# Patient Record
Sex: Female | Born: 1970 | Race: Black or African American | Hispanic: No | Marital: Single | Smoking: Never smoker
Health system: Southern US, Community
[De-identification: ages and names within clinical notes are randomized; demographics above are authoritative.]

## PROBLEM LIST (undated history)

## (undated) DIAGNOSIS — D569 Thalassemia, unspecified: Secondary | ICD-10-CM

## (undated) DIAGNOSIS — R011 Cardiac murmur, unspecified: Secondary | ICD-10-CM

## (undated) DIAGNOSIS — S83289A Other tear of lateral meniscus, current injury, unspecified knee, initial encounter: Secondary | ICD-10-CM

## (undated) DIAGNOSIS — S83249A Other tear of medial meniscus, current injury, unspecified knee, initial encounter: Secondary | ICD-10-CM

## (undated) DIAGNOSIS — M797 Fibromyalgia: Secondary | ICD-10-CM

## (undated) DIAGNOSIS — I82409 Acute embolism and thrombosis of unspecified deep veins of unspecified lower extremity: Secondary | ICD-10-CM

## (undated) HISTORY — PX: DILATION AND CURETTAGE OF UTERUS: SHX78

## (undated) HISTORY — PX: KNEE SURGERY: SHX244

## (undated) HISTORY — PX: HERNIA REPAIR: SHX51

## (undated) HISTORY — PX: OTHER SURGICAL HISTORY: SHX169

## (undated) HISTORY — DX: Cardiac murmur, unspecified: R01.1

## (undated) HISTORY — PX: LAPAROSCOPIC GASTRIC BANDING: SHX1100

---

## 2011-04-20 ENCOUNTER — Emergency Department (HOSPITAL_COMMUNITY): Payer: Medicaid - Out of State

## 2011-04-20 ENCOUNTER — Emergency Department (HOSPITAL_COMMUNITY)
Admission: EM | Admit: 2011-04-20 | Discharge: 2011-04-20 | Disposition: A | Discharge status: Home / Self Care | Payer: Medicaid - Out of State | Attending: Emergency Medicine | Admitting: Emergency Medicine

## 2011-04-20 DIAGNOSIS — K5900 Constipation, unspecified: Secondary | ICD-10-CM | POA: Insufficient documentation

## 2011-04-20 DIAGNOSIS — IMO0001 Reserved for inherently not codable concepts without codable children: Secondary | ICD-10-CM | POA: Insufficient documentation

## 2011-04-20 DIAGNOSIS — R1013 Epigastric pain: Secondary | ICD-10-CM | POA: Insufficient documentation

## 2011-04-20 LAB — COMPREHENSIVE METABOLIC PANEL
BUN: 14 mg/dL (ref 6–23)
Calcium: 9.4 mg/dL (ref 8.4–10.5)
Creatinine, Ser: 0.63 mg/dL (ref 0.50–1.10)
GFR calc Af Amer: >90 mL/min (ref >90)
GFR calc non Af Amer: >90 mL/min (ref >90)
Glucose, Bld: 87 mg/dL (ref 70–99)
Sodium: 139 mEq/L (ref 135–145)
Total Protein: 7.5 g/dL (ref 6.0–8.3)

## 2011-04-20 LAB — CBC
HCT: 30.5 % — ABNORMAL LOW (ref 36.0–46.0)
MCH: 20.4 pg — ABNORMAL LOW (ref 26.0–34.0)
MCHC: 30.8 g/dL (ref 30.0–36.0)
MCV: 66.2 fL — ABNORMAL LOW (ref 78.0–100.0)
RDW: 16.8 % — ABNORMAL HIGH (ref 11.5–15.5)

## 2011-04-20 LAB — URINALYSIS, ROUTINE W REFLEX MICROSCOPIC
Nitrite: NEGATIVE
Protein, ur: 30 mg/dL — AB
Specific Gravity, Urine: 1.045 — ABNORMAL HIGH (ref 1.005–1.030)
Urobilinogen, UA: 1 mg/dL (ref 0.0–1.0)

## 2011-04-20 LAB — LIPASE, BLOOD: Lipase: 18 U/L (ref 11–59)

## 2011-04-20 LAB — DIFFERENTIAL
Eosinophils Relative: 9 % — ABNORMAL HIGH (ref 0–5)
Lymphs Abs: 2.5 10*3/uL (ref 0.7–4.0)
Monocytes Absolute: 0.4 10*3/uL (ref 0.1–1.0)
Neutro Abs: 2.6 10*3/uL (ref 1.7–7.7)

## 2011-04-20 LAB — URINE MICROSCOPIC-ADD ON

## 2011-11-20 ENCOUNTER — Other Ambulatory Visit: Payer: Self-pay | Admitting: Orthopedic Surgery

## 2011-12-04 ENCOUNTER — Ambulatory Visit (HOSPITAL_BASED_OUTPATIENT_CLINIC_OR_DEPARTMENT_OTHER): Admission: RE | Admit: 2011-12-04 | Payer: Self-pay | Source: Ambulatory Visit | Admitting: Orthopedic Surgery

## 2011-12-04 ENCOUNTER — Encounter (HOSPITAL_BASED_OUTPATIENT_CLINIC_OR_DEPARTMENT_OTHER): Admission: RE | Payer: Self-pay | Source: Ambulatory Visit

## 2011-12-04 SURGERY — ARTHROSCOPY, KNEE, WITH MENISCUS REPAIR
Anesthesia: Choice | Laterality: Right

## 2012-01-26 ENCOUNTER — Other Ambulatory Visit: Payer: Self-pay | Admitting: Obstetrics

## 2012-01-26 DIAGNOSIS — Z1231 Encounter for screening mammogram for malignant neoplasm of breast: Secondary | ICD-10-CM

## 2012-02-12 ENCOUNTER — Ambulatory Visit (HOSPITAL_COMMUNITY): Payer: Medicaid Other

## 2012-03-01 ENCOUNTER — Ambulatory Visit (HOSPITAL_COMMUNITY)
Admission: RE | Admit: 2012-03-01 | Discharge: 2012-03-01 | Disposition: A | Discharge status: Home / Self Care | Payer: Medicaid Other | Source: Ambulatory Visit | Attending: Obstetrics | Admitting: Obstetrics

## 2012-03-01 DIAGNOSIS — Z1231 Encounter for screening mammogram for malignant neoplasm of breast: Secondary | ICD-10-CM

## 2012-03-03 ENCOUNTER — Other Ambulatory Visit: Payer: Self-pay | Admitting: Internal Medicine

## 2012-03-03 DIAGNOSIS — E049 Nontoxic goiter, unspecified: Secondary | ICD-10-CM

## 2012-03-07 ENCOUNTER — Other Ambulatory Visit (HOSPITAL_COMMUNITY): Payer: Medicaid Other

## 2012-03-15 ENCOUNTER — Ambulatory Visit (HOSPITAL_COMMUNITY)
Admission: RE | Admit: 2012-03-15 | Discharge: 2012-03-15 | Disposition: A | Discharge status: Home / Self Care | Payer: Medicaid Other | Source: Ambulatory Visit | Attending: Internal Medicine | Admitting: Internal Medicine

## 2012-03-15 DIAGNOSIS — E041 Nontoxic single thyroid nodule: Secondary | ICD-10-CM | POA: Insufficient documentation

## 2012-03-15 DIAGNOSIS — E049 Nontoxic goiter, unspecified: Secondary | ICD-10-CM

## 2012-03-17 ENCOUNTER — Emergency Department (HOSPITAL_COMMUNITY): Payer: Medicaid Other

## 2012-03-17 ENCOUNTER — Encounter (HOSPITAL_COMMUNITY): Payer: Self-pay | Admitting: Emergency Medicine

## 2012-03-17 ENCOUNTER — Emergency Department (HOSPITAL_COMMUNITY)
Admission: EM | Admit: 2012-03-17 | Discharge: 2012-03-18 | Disposition: A | Discharge status: Home / Self Care | Payer: Medicaid Other | Attending: Emergency Medicine | Admitting: Emergency Medicine

## 2012-03-17 DIAGNOSIS — N949 Unspecified condition associated with female genital organs and menstrual cycle: Secondary | ICD-10-CM | POA: Insufficient documentation

## 2012-03-17 DIAGNOSIS — D649 Anemia, unspecified: Secondary | ICD-10-CM | POA: Insufficient documentation

## 2012-03-17 DIAGNOSIS — Z9884 Bariatric surgery status: Secondary | ICD-10-CM | POA: Insufficient documentation

## 2012-03-17 DIAGNOSIS — R1013 Epigastric pain: Secondary | ICD-10-CM | POA: Insufficient documentation

## 2012-03-17 DIAGNOSIS — K5900 Constipation, unspecified: Secondary | ICD-10-CM | POA: Insufficient documentation

## 2012-03-17 DIAGNOSIS — N938 Other specified abnormal uterine and vaginal bleeding: Secondary | ICD-10-CM | POA: Insufficient documentation

## 2012-03-17 HISTORY — DX: Fibromyalgia: M79.7

## 2012-03-17 HISTORY — DX: Thalassemia, unspecified: D56.9

## 2012-03-17 LAB — CBC
HCT: 26.9 % — ABNORMAL LOW (ref 36.0–46.0)
Hemoglobin: 7.8 g/dL — ABNORMAL LOW (ref 12.0–15.0)
MCH: 18.1 pg — ABNORMAL LOW (ref 26.0–34.0)
MCHC: 29 g/dL — ABNORMAL LOW (ref 30.0–36.0)
RBC: 4.3 MIL/uL (ref 3.87–5.11)

## 2012-03-17 LAB — POCT I-STAT, CHEM 8
BUN: 4 mg/dL — ABNORMAL LOW (ref 6–23)
Chloride: 104 mEq/L (ref 96–112)
Creatinine, Ser: 0.9 mg/dL (ref 0.50–1.10)
Sodium: 142 mEq/L (ref 135–145)
TCO2: 25 mmol/L (ref 0–100)

## 2012-03-17 LAB — URINALYSIS, ROUTINE W REFLEX MICROSCOPIC
Bilirubin Urine: NEGATIVE
Glucose, UA: NEGATIVE mg/dL
Ketones, ur: NEGATIVE mg/dL
Nitrite: NEGATIVE
Specific Gravity, Urine: 1.024 (ref 1.005–1.030)
pH: 7 (ref 5.0–8.0)

## 2012-03-17 LAB — URINE MICROSCOPIC-ADD ON

## 2012-03-17 LAB — POCT PREGNANCY, URINE: Preg Test, Ur: NEGATIVE

## 2012-03-17 MED ORDER — GI COCKTAIL ~~LOC~~
30.0000 mL | Freq: Once | ORAL | Status: AC
  Administered 2012-03-17: 30 mL via ORAL
  Filled 2012-03-17: qty 30

## 2012-03-17 NOTE — ED Notes (Signed)
Reports pain in upper abd X 4 days ago, started [facility] 4 months ago, then 6 days ago having vaginal bleeding-has gone through 10 pads yesterday, reports it has eased up some today; pt taking Loestrin; denies n/v

## 2012-03-17 NOTE — ED Notes (Signed)
Pt states, "I am half way through my pill packet & had another 9 days before my cycle is suppose to start. I get a lot of UTIs & only know I have them when my urine smells. My urine does not have that smell right now."

## 2012-03-17 NOTE — ED Notes (Signed)
Patient transported to X-ray 

## 2012-03-17 NOTE — ED Notes (Signed)
Dr Otter at bedside  

## 2012-03-18 ENCOUNTER — Other Ambulatory Visit (HOSPITAL_COMMUNITY): Payer: Medicaid Other

## 2012-03-18 ENCOUNTER — Emergency Department (HOSPITAL_COMMUNITY): Payer: Medicaid Other

## 2012-03-18 LAB — COMPREHENSIVE METABOLIC PANEL
ALT: 7 U/L (ref 0–35)
Alkaline Phosphatase: 61 U/L (ref 39–117)
BUN: 5 mg/dL — ABNORMAL LOW (ref 6–23)
CO2: 26 mEq/L (ref 19–32)
Chloride: 104 mEq/L (ref 96–112)
GFR calc Af Amer: >90 mL/min (ref >90)
GFR calc non Af Amer: >90 mL/min (ref >90)
Glucose, Bld: 92 mg/dL (ref 70–99)
Potassium: 3.4 mEq/L — ABNORMAL LOW (ref 3.5–5.1)
Sodium: 140 mEq/L (ref 135–145)
Total Bilirubin: 0.2 mg/dL — ABNORMAL LOW (ref 0.3–1.2)

## 2012-03-18 MED ORDER — FERROUS SULFATE 325 (65 FE) MG PO TABS: 325.0000 mg | ORAL_TABLET | Freq: Every day | ORAL | Status: DC

## 2012-03-18 MED ORDER — DOCUSATE SODIUM 100 MG PO CAPS: 100.0000 mg | ORAL_CAPSULE | Freq: Two times a day (BID) | ORAL | Status: AC

## 2012-03-18 MED ORDER — PANTOPRAZOLE SODIUM 20 MG PO TBEC: 40.0000 mg | DELAYED_RELEASE_TABLET | Freq: Every day | ORAL | Status: DC

## 2012-03-18 NOTE — ED Provider Notes (Signed)
History     CSN: [id number]  Arrival date & time 03/17/12  1901   First MD Initiated Contact with Patient 03/17/12 2256      Chief Complaint  Patient presents with  . Vaginal Bleeding    (Consider location/radiation/quality/duration/timing/severity/associated sxs/prior treatment) HPI 41 year old female presents to emergency department complaining of heavy vaginal bleeding for the last 6 days, better today along with upper abdominal pain for the last 4 days. Patient with history of anemia/thalassemia, is on OCP, Loestrin for birth control use. She's been on it for the last 4 months. She denies missing any doses or taking them at different times of the day. No other new medications. Patient reports she was going through about a pad an hour yesterday. She occasionally gets lightheaded and dizzy but no worse today than any other day. No chest pain or shortness of breath. Patient with history of lap band done over a year ago, also has history of GERD. She's been taking Pepcid without improvement in her symptoms. She has history of chronic constipation for which she takes milk of magnesia. No fevers no chills. No vaginal discharge, no lower abdominal pain. No history of fibroids. Patient was told by her gynecologist to come to the ER for workup.  Past Medical History  Diagnosis Date  . Fibromyalgia   . Thalassanemia     Past Surgical History  Procedure Date  . Knee surgery     L knee  . Laparoscopic gastric banding     History reviewed. No pertinent family history.  History  Substance Use Topics  . Smoking status: Never Smoker   . Smokeless tobacco: Not on file  . Alcohol Use: Yes     occasionally    OB History    Grav Para Term Preterm Abortions TAB SAB Ect Mult Living                  Review of Systems  All other systems reviewed and are negative.    Allergies  Review of patient's allergies indicates no known allergies.  Home Medications   Current Outpatient Rx    Name Route Sig Dispense Refill  . CETIRIZINE HCL 10 MG PO TABS Oral Take 10 mg by mouth daily as needed. For allergies    . FAMOTIDINE 10 MG PO CHEW Oral Chew 20 mg by mouth 2 (two) times daily as needed. For stomach pain    . GABAPENTIN 800 MG PO TABS Oral Take 800 mg by mouth 3 (three) times daily.    . IBUPROFEN 600 MG PO TABS Oral Take 600 mg by mouth every 6 (six) hours as needed. For pain    . OXYCODONE HCL ER 10 MG PO TB12 Oral Take 10 mg by mouth every 12 (twelve) hours. For pain    . TRAMADOL HCL 50 MG PO TABS Oral Take 50 mg by mouth 3 (three) times daily.      BP 119/72  Pulse 65  Temp 98.4 F (36.9 C) (Oral)  Resp 18  SpO2 100%  LMP 03/17/2012  Physical Exam  Nursing note and vitals reviewed. Constitutional: She is oriented to person, place, and time. She appears well-developed and well-nourished.  HENT:  Head: Normocephalic and atraumatic.  Nose: Nose normal.  Mouth/Throat: Oropharynx is clear and moist.  Eyes: Conjunctivae and EOM are normal. Pupils are equal, round, and reactive to light.  Neck: Normal range of motion. Neck supple. No JVD present. No tracheal deviation present. No thyromegaly present.  Cardiovascular: Normal  rate, regular rhythm, normal heart sounds and intact distal pulses.  Exam reveals no gallop and no friction rub.   No murmur heard. Pulmonary/Chest: Effort normal and breath sounds normal. No stridor. No respiratory distress. She has no wheezes. She has no rales. She exhibits no tenderness.  Abdominal: Soft. Bowel sounds are normal. She exhibits no distension and no mass. There is no tenderness. There is no rebound and no guarding.  Genitourinary: Uterus normal. Vaginal discharge (thin bloody discharge) found.       No adnexal masses or tenderness, no uterine masses or tenderness, no cervical motion tenderness  Musculoskeletal: Normal range of motion. She exhibits no edema and no tenderness.  Lymphadenopathy:    She has no cervical adenopathy.   Neurological: She is alert and oriented to person, place, and time. She has normal reflexes. She exhibits normal muscle tone. Coordination normal.  Skin: Skin is warm and dry. No rash noted. No erythema. No pallor.  Psychiatric: She has a normal mood and affect. Her behavior is normal. Judgment and thought content normal.    ED Course  Procedures (including critical care time)  Labs Reviewed  URINALYSIS, ROUTINE W REFLEX MICROSCOPIC - Abnormal; Notable for the following:    Hgb urine dipstick LARGE (*)     Leukocytes, UA SMALL (*)     All other components within normal limits  CBC - Abnormal; Notable for the following:    Hemoglobin 7.8 (*)     HCT 26.9 (*)     MCV 62.6 (*)     MCH 18.1 (*)     MCHC 29.0 (*)     RDW 17.0 (*)     All other components within normal limits  POCT I-STAT, CHEM 8 - Abnormal; Notable for the following:    BUN 4 (*)     Glucose, Bld 106 (*)     Calcium, Ion 1.24 (*)     Hemoglobin 10.2 (*)     HCT 30.0 (*)     All other components within normal limits  URINE MICROSCOPIC-ADD ON - Abnormal; Notable for the following:    Squamous Epithelial / LPF FEW (*)     Bacteria, UA FEW (*)     All other components within normal limits  COMPREHENSIVE METABOLIC PANEL - Abnormal; Notable for the following:    Potassium 3.4 (*)     BUN 5 (*)     Albumin 3.2 (*)     Total Bilirubin 0.2 (*)     All other components within normal limits  POCT PREGNANCY, URINE  LIPASE, BLOOD   US Transvaginal Non-ob  03/18/2012  *RADIOLOGY REPORT*  Clinical Data: Vaginal bleeding, anemia, pain  TRANSABDOMINAL AND TRANSVAGINAL ULTRASOUND OF PELVIS Technique:  Both transabdominal and transvaginal ultrasound examinations of the pelvis were performed. Transabdominal technique was performed for global imaging of the pelvis including uterus, ovaries, adnexal regions, and pelvic cul-de-sac.  It was necessary to proceed with endovaginal exam following the transabdominal exam to visualize the  endometrium and ovaries.  Comparison:  None  Findings:  Uterus: 8.4 cm length by 5.6 cm AP by 7.1 cm transverse. Retroflexed.  Two small leiomyoma are to identified, including a subserosal leiomyoma 1.4 x 1.1 x 1.8 cm at the mid uterine segment and and intramural leiomyoma at the upper uterine segment 2.7 x 3.0 x 2.7 cm.  The larger lesion questionably extends submucosal.  Endometrium: Suboptimally visualized, proximally 8 mm thick.  No endometrial fluid peri  Right ovary:  3.3 x  2.3 x 2.2 cm.  Septated cyst within the right ovary 2.4 x 1.6 x 1.4 cm.  Septation is thin without mural nodularity.  Blood flow present within right ovary on color Doppler imaging.  Left ovary: 3.0 x 1.4 x 1.2 cm.  Normal morphology without mass. Blood flow present within left ovary on color Doppler imaging.  Other findings: Small amount nonspecific free pelvic fluid.  IMPRESSION: Small amount nonspecific free pelvic fluid. Two uterine leiomyomata, the largest of which measures 3.0 cm in greatest size and questionably extends submucosal at the uterine fundus. Minimally complicated cyst right ovary containing a thin septation.   Original Report Authenticated By: Lollie Marrow, M.D.    US Pelvis Complete  03/18/2012  *RADIOLOGY REPORT*  Clinical Data: Vaginal bleeding, anemia, pain  TRANSABDOMINAL AND TRANSVAGINAL ULTRASOUND OF PELVIS Technique:  Both transabdominal and transvaginal ultrasound examinations of the pelvis were performed. Transabdominal technique was performed for global imaging of the pelvis including uterus, ovaries, adnexal regions, and pelvic cul-de-sac.  It was necessary to proceed with endovaginal exam following the transabdominal exam to visualize the endometrium and ovaries.  Comparison:  None  Findings:  Uterus: 8.4 cm length by 5.6 cm AP by 7.1 cm transverse. Retroflexed.  Two small leiomyoma are to identified, including a subserosal leiomyoma 1.4 x 1.1 x 1.8 cm at the mid uterine segment and and intramural  leiomyoma at the upper uterine segment 2.7 x 3.0 x 2.7 cm.  The larger lesion questionably extends submucosal.  Endometrium: Suboptimally visualized, proximally 8 mm thick.  No endometrial fluid peri  Right ovary:  3.3 x 2.3 x 2.2 cm.  Septated cyst within the right ovary 2.4 x 1.6 x 1.4 cm.  Septation is thin without mural nodularity.  Blood flow present within right ovary on color Doppler imaging.  Left ovary: 3.0 x 1.4 x 1.2 cm.  Normal morphology without mass. Blood flow present within left ovary on color Doppler imaging.  Other findings: Small amount nonspecific free pelvic fluid.  IMPRESSION: Small amount nonspecific free pelvic fluid. Two uterine leiomyomata, the largest of which measures 3.0 cm in greatest size and questionably extends submucosal at the uterine fundus. Minimally complicated cyst right ovary containing a thin septation.   Original Report Authenticated By: Lollie Marrow, M.D.    Dg Abd Acute W/chest  03/18/2012  *RADIOLOGY REPORT*  Clinical Data: Upper abdominal pain and pressure, heavy menstruation for 4 days, question constipation, past history laparoscopic gastric band procedure  ACUTE ABDOMEN SERIES (ABDOMEN 2 VIEW & CHEST 1 VIEW)  Comparison: 04/20/2011  Findings: Upper-normal size of cardiac silhouette. Tortuous aorta. Pulmonary vascularity normal. Lungs clear. Laparoscopic gastric band identified at proximal stomach with reservoir in left lower quadrant. Increased stool in colon. No bowel dilatation, bowel wall thickening or free intraperitoneal air. Bones unremarkable. No urinary tract calcification.  IMPRESSION: Increased stool in colon, clinically consider constipation.   Original Report Authenticated By: Lollie Marrow, M.D.      1. Epigastric pain   2. Dysfunctional uterine bleeding   3. Constipation   4. Anemia       MDM  41 year old female with heavy vaginal bleeding in the middle of the pill pack, along with upper abdominal pain with history of lap band.  Differential includes obstruction secondary to lap band, however this is unlikely given that she is not vomiting her nauseated. Ongoing gastritis, worsening constipation. Suspect vaginal bleeding secondary to breakthrough bleeding on the pill, but will check ultrasound for possible fibroids leading to  heavy bleeding. We'll get acute abdominal series and give GI cocktail.  1:16 AM Patient feeling better after GI cocktail, no signs of obstruction from her lab band, constipation present. Ultrasound of pelvis shows uterine leiomyomata.  Anemia noted.  Pt reports she had cbc about 4 months ago with hgb of 7.  Pt does not show any signs of acute decompensation from anemia, no tachycardia, cp, or sob.  D/w Dr Gaynell Face on call for her gyn group who recommends continuing the pack and calling for appointment on Monday.        Olivia Mackie, MD 03/18/12 [phone]

## 2012-03-18 NOTE — ED Notes (Signed)
PT back from radiology.  Denies any passage of clots or any lower abd pain. VS wnl.

## 2012-03-19 ENCOUNTER — Other Ambulatory Visit (HOSPITAL_COMMUNITY): Payer: Medicaid Other

## 2012-05-12 ENCOUNTER — Other Ambulatory Visit: Payer: Self-pay | Admitting: Orthopedic Surgery

## 2012-05-17 ENCOUNTER — Encounter (HOSPITAL_BASED_OUTPATIENT_CLINIC_OR_DEPARTMENT_OTHER): Payer: Self-pay | Admitting: *Deleted

## 2012-05-17 NOTE — Progress Notes (Signed)
Pt lives with brother-from [place] Has had lt knee done and lap band-lost 70 lb. No cardiac or resp problems

## 2012-05-20 ENCOUNTER — Encounter (HOSPITAL_BASED_OUTPATIENT_CLINIC_OR_DEPARTMENT_OTHER): Payer: Self-pay | Admitting: Orthopedic Surgery

## 2012-05-20 ENCOUNTER — Ambulatory Visit (HOSPITAL_BASED_OUTPATIENT_CLINIC_OR_DEPARTMENT_OTHER)
Admission: RE | Admit: 2012-05-20 | Discharge: 2012-05-20 | Disposition: A | Discharge status: Home / Self Care | Payer: Medicaid Other | Source: Ambulatory Visit | Attending: Orthopedic Surgery | Admitting: Orthopedic Surgery

## 2012-05-20 ENCOUNTER — Encounter (HOSPITAL_BASED_OUTPATIENT_CLINIC_OR_DEPARTMENT_OTHER): Payer: Self-pay | Admitting: *Deleted

## 2012-05-20 ENCOUNTER — Ambulatory Visit (HOSPITAL_BASED_OUTPATIENT_CLINIC_OR_DEPARTMENT_OTHER): Payer: Medicaid Other | Admitting: Anesthesiology

## 2012-05-20 ENCOUNTER — Encounter (HOSPITAL_BASED_OUTPATIENT_CLINIC_OR_DEPARTMENT_OTHER): Payer: Self-pay | Admitting: Anesthesiology

## 2012-05-20 ENCOUNTER — Encounter (HOSPITAL_BASED_OUTPATIENT_CLINIC_OR_DEPARTMENT_OTHER)
Admission: RE | Disposition: A | Discharge status: Home / Self Care | Payer: Self-pay | Source: Ambulatory Visit | Attending: Orthopedic Surgery

## 2012-05-20 DIAGNOSIS — X58XXXA Exposure to other specified factors, initial encounter: Secondary | ICD-10-CM | POA: Insufficient documentation

## 2012-05-20 DIAGNOSIS — D569 Thalassemia, unspecified: Secondary | ICD-10-CM | POA: Insufficient documentation

## 2012-05-20 DIAGNOSIS — S83289A Other tear of lateral meniscus, current injury, unspecified knee, initial encounter: Secondary | ICD-10-CM | POA: Diagnosis present

## 2012-05-20 DIAGNOSIS — S83249A Other tear of medial meniscus, current injury, unspecified knee, initial encounter: Secondary | ICD-10-CM

## 2012-05-20 DIAGNOSIS — IMO0001 Reserved for inherently not codable concepts without codable children: Secondary | ICD-10-CM | POA: Insufficient documentation

## 2012-05-20 DIAGNOSIS — Z79899 Other long term (current) drug therapy: Secondary | ICD-10-CM | POA: Insufficient documentation

## 2012-05-20 DIAGNOSIS — IMO0002 Reserved for concepts with insufficient information to code with codable children: Secondary | ICD-10-CM | POA: Insufficient documentation

## 2012-05-20 HISTORY — DX: Other tear of medial meniscus, current injury, unspecified knee, initial encounter: S83.249A

## 2012-05-20 HISTORY — PX: KNEE ARTHROSCOPY WITH LATERAL MENISECTOMY: SHX6193

## 2012-05-20 HISTORY — DX: Other tear of lateral meniscus, current injury, unspecified knee, initial encounter: S83.289A

## 2012-05-20 SURGERY — ARTHROSCOPY, KNEE, WITH LATERAL MENISCECTOMY
Anesthesia: General | Site: Knee | Laterality: Right | Wound class: Clean

## 2012-05-20 MED ORDER — SODIUM CHLORIDE 0.9 % IR SOLN
Status: DC | PRN
  Administered 2012-05-20: 6000 mL

## 2012-05-20 MED ORDER — KETOROLAC TROMETHAMINE 10 MG PO TABS: 10.0000 mg | ORAL_TABLET | Freq: Four times a day (QID) | ORAL | Status: DC | PRN

## 2012-05-20 MED ORDER — LIDOCAINE HCL (CARDIAC) 20 MG/ML IV SOLN
INTRAVENOUS | Status: DC | PRN
  Administered 2012-05-20: 100 mg via INTRAVENOUS

## 2012-05-20 MED ORDER — PROMETHAZINE HCL 25 MG PO TABS: 25.0000 mg | ORAL_TABLET | Freq: Four times a day (QID) | ORAL | Status: DC | PRN

## 2012-05-20 MED ORDER — METHOCARBAMOL 500 MG PO TABS: 500.0000 mg | ORAL_TABLET | Freq: Four times a day (QID) | ORAL | Status: DC

## 2012-05-20 MED ORDER — ONDANSETRON HCL 4 MG/2ML IJ SOLN: 4.0000 mg | Freq: Once | INTRAMUSCULAR | Status: DC | PRN

## 2012-05-20 MED ORDER — OXYCODONE HCL 5 MG PO TABS
5.0000 mg | ORAL_TABLET | Freq: Once | ORAL | Status: AC | PRN
  Administered 2012-05-20: 5 mg via ORAL

## 2012-05-20 MED ORDER — PROPOFOL 10 MG/ML IV BOLUS
INTRAVENOUS | Status: DC | PRN
  Administered 2012-05-20: 200 mg via INTRAVENOUS

## 2012-05-20 MED ORDER — KETOROLAC TROMETHAMINE 30 MG/ML IJ SOLN
30.0000 mg | Freq: Once | INTRAMUSCULAR | Status: AC
  Administered 2012-05-20: 30 mg via INTRAVENOUS

## 2012-05-20 MED ORDER — MEPERIDINE HCL 25 MG/ML IJ SOLN: 6.2500 mg | INTRAMUSCULAR | Status: DC | PRN

## 2012-05-20 MED ORDER — OXYCODONE-ACETAMINOPHEN 10-325 MG PO TABS: 1.0000 | ORAL_TABLET | Freq: Four times a day (QID) | ORAL | Status: DC | PRN

## 2012-05-20 MED ORDER — SENNA-DOCUSATE SODIUM 8.6-50 MG PO TABS: 1.0000 | ORAL_TABLET | Freq: Every day | ORAL | Status: DC

## 2012-05-20 MED ORDER — HYDROMORPHONE HCL PF 1 MG/ML IJ SOLN
0.2500 mg | INTRAMUSCULAR | Status: DC | PRN
  Administered 2012-05-20 (×4): 0.5 mg via INTRAVENOUS

## 2012-05-20 MED ORDER — BISACODYL 5 MG PO TBEC: 5.0000 mg | DELAYED_RELEASE_TABLET | Freq: Every day | ORAL | Status: DC | PRN

## 2012-05-20 MED ORDER — MIDAZOLAM HCL 5 MG/5ML IJ SOLN
INTRAMUSCULAR | Status: DC | PRN
  Administered 2012-05-20: 2 mg via INTRAVENOUS

## 2012-05-20 MED ORDER — LACTATED RINGERS IV SOLN
INTRAVENOUS | Status: DC
Start: 2012-05-20 — End: 2012-05-20
  Administered 2012-05-20: 07:00:00 via INTRAVENOUS

## 2012-05-20 MED ORDER — ONDANSETRON HCL 4 MG/2ML IJ SOLN
INTRAMUSCULAR | Status: DC | PRN
  Administered 2012-05-20: 4 mg via INTRAVENOUS

## 2012-05-20 MED ORDER — BUPIVACAINE HCL (PF) 0.5 % IJ SOLN
INTRAMUSCULAR | Status: DC | PRN
  Administered 2012-05-20: 20 mL

## 2012-05-20 MED ORDER — DEXAMETHASONE SODIUM PHOSPHATE 4 MG/ML IJ SOLN
INTRAMUSCULAR | Status: DC | PRN
  Administered 2012-05-20: 10 mg via INTRAVENOUS

## 2012-05-20 MED ORDER — FENTANYL CITRATE 0.05 MG/ML IJ SOLN
INTRAMUSCULAR | Status: DC | PRN
  Administered 2012-05-20: 100 ug via INTRAVENOUS

## 2012-05-20 MED ORDER — CEFAZOLIN SODIUM-DEXTROSE 2-3 GM-% IV SOLR: 2.0000 g | INTRAVENOUS | Status: DC

## 2012-05-20 MED ORDER — OXYCODONE HCL 5 MG/5ML PO SOLN: 5.0000 mg | Freq: Once | ORAL | Status: AC | PRN

## 2012-05-20 SURGICAL SUPPLY — 46 items
BANDAGE ELASTIC 6 VELCRO ST LF (GAUZE/BANDAGES/DRESSINGS) ×2 IMPLANT
BANDAGE ESMARK 6X9 LF (GAUZE/BANDAGES/DRESSINGS) IMPLANT
BENZOIN TINCTURE PRP APPL 2/3 (GAUZE/BANDAGES/DRESSINGS) ×2 IMPLANT
BLADE CUDA 5.5 (BLADE) IMPLANT
BLADE CUDA GRT WHITE 3.5 (BLADE) IMPLANT
BLADE CUDA SHAVER 3.5 (BLADE) IMPLANT
BLADE CUTTER GATOR 3.5 (BLADE) ×2 IMPLANT
BLADE CUTTER MENIS 5.5 (BLADE) IMPLANT
BLADE GREAT WHITE 4.2 (BLADE) IMPLANT
BNDG ESMARK 6X9 LF (GAUZE/BANDAGES/DRESSINGS)
CANISTER OMNI JUG 16 LITER (MISCELLANEOUS) ×2 IMPLANT
CANISTER SUCTION 2500CC (MISCELLANEOUS) IMPLANT
CLOTH BEACON ORANGE TIMEOUT ST (SAFETY) ×2 IMPLANT
CUFF TOURNIQUET SINGLE 34IN LL (TOURNIQUET CUFF) IMPLANT
CUTTER KNOT PUSHER 2-0 FIBERWI (INSTRUMENTS) IMPLANT
CUTTER MENISCUS  4.2MM (BLADE)
CUTTER MENISCUS 4.2MM (BLADE) IMPLANT
DRAPE ARTHROSCOPY W/POUCH 90 (DRAPES) ×2 IMPLANT
DURAPREP 26ML APPLICATOR (WOUND CARE) ×2 IMPLANT
ELECT MENISCUS 165MM 90D (ELECTRODE) IMPLANT
ELECT REM PT RETURN 9FT ADLT (ELECTROSURGICAL)
ELECTRODE REM PT RTRN 9FT ADLT (ELECTROSURGICAL) IMPLANT
GLOVE BIO SURGEON STRL SZ8 (GLOVE) ×2 IMPLANT
GLOVE BIOGEL M STRL SZ7.5 (GLOVE) ×2 IMPLANT
GLOVE BIOGEL PI IND STRL 8 (GLOVE) ×3 IMPLANT
GLOVE BIOGEL PI INDICATOR 8 (GLOVE) ×3
GLOVE ORTHO TXT STRL SZ7.5 (GLOVE) ×2 IMPLANT
GOWN BRE IMP PREV XXLGXLNG (GOWN DISPOSABLE) ×4 IMPLANT
GOWN STRL REIN 2XL LVL4 (GOWN DISPOSABLE) ×2 IMPLANT
HOLDER KNEE FOAM BLUE (MISCELLANEOUS) ×2 IMPLANT
KNEE WRAP E Z 3 GEL PACK (MISCELLANEOUS) ×2 IMPLANT
NEEDLE MENISCAL REPAIR DBL ARM (NEEDLE) IMPLANT
NEEDLE MENISCAL REPAIR W/EYELT (NEEDLE) IMPLANT
PACK ARTHROSCOPY DSU (CUSTOM PROCEDURE TRAY) ×2 IMPLANT
PACK BASIN DAY SURGERY FS (CUSTOM PROCEDURE TRAY) ×2 IMPLANT
PENCIL BUTTON HOLSTER BLD 10FT (ELECTRODE) IMPLANT
SET ARTHROSCOPY TUBING (MISCELLANEOUS) ×1
SET ARTHROSCOPY TUBING LN (MISCELLANEOUS) ×1 IMPLANT
SLEEVE SCD COMPRESS KNEE MED (MISCELLANEOUS) ×2 IMPLANT
SPONGE GAUZE 4X4 12PLY (GAUZE/BANDAGES/DRESSINGS) ×2 IMPLANT
STRIP CLOSURE SKIN 1/2X4 (GAUZE/BANDAGES/DRESSINGS) ×2 IMPLANT
SUT MNCRL AB 4-0 PS2 18 (SUTURE) ×2 IMPLANT
TOWEL OR 17X24 6PK STRL BLUE (TOWEL DISPOSABLE) ×2 IMPLANT
TOWEL OR NON WOVEN STRL DISP B (DISPOSABLE) IMPLANT
WAND STAR VAC 90 (SURGICAL WAND) ×2 IMPLANT
WATER STERILE IRR 1000ML POUR (IV SOLUTION) ×2 IMPLANT

## 2012-05-20 NOTE — Anesthesia Postprocedure Evaluation (Signed)
Anesthesia Post Note  Patient: Meghan Solis  Procedure(s) Performed: Procedure(s) (LRB): KNEE ARTHROSCOPY WITH LATERAL MENISECTOMY (Right)  Anesthesia type: general  Patient location: PACU  Post pain: Pain level controlled  Post assessment: Patient's Cardiovascular Status Stable  Last Vitals:  Filed Vitals:   05/20/12 1018  BP: 115/79  Pulse: 90  Temp: 36.6 C  Resp: 20    Post vital signs: Reviewed and stable  Level of consciousness: sedated  Complications: No apparent anesthesia complications

## 2012-05-20 NOTE — Anesthesia Preprocedure Evaluation (Signed)

## 2012-05-20 NOTE — Anesthesia Postprocedure Evaluation (Signed)
Anesthesia Post Note  Patient: Meghan Solis  Procedure(s) Performed: Procedure(s) (LRB): KNEE ARTHROSCOPY WITH LATERAL MENISECTOMY (Right)  Anesthesia type: general  Patient location: PACU  Post pain: Pain level controlled  Post assessment: Patient's Cardiovascular Status Stable  Last Vitals:  Filed Vitals:   05/20/12 1018  BP: 115/79  Pulse: 90  Temp: 36.6 C  Resp: 20    Post vital signs: Reviewed and stable  Level of consciousness: sedated  Complications: No apparent anesthesia complications  

## 2012-05-20 NOTE — Transfer of Care (Signed)
Immediate Anesthesia Transfer of Care Note  Patient: Meghan Solis  Procedure(s) Performed: Procedure(s) (LRB) with comments: KNEE ARTHROSCOPY WITH LATERAL MENISECTOMY (Right) - RIGHT KNEE ARTHROSCOPY LATERAL AND MEDIAL MENISCECTOMY   Patient Location: PACU  Anesthesia Type:General  Level of Consciousness: sedated and patient cooperative  Airway & Oxygen Therapy: Patient Spontanous Breathing and Patient connected to face mask oxygen  Post-op Assessment: Report given to PACU RN and Post -op Vital signs reviewed and stable  Post vital signs: Reviewed and stable  Complications: No apparent anesthesia complications

## 2012-05-20 NOTE — Anesthesia Procedure Notes (Signed)
Procedure Name: LMA Insertion Date/Time: 05/20/2012 7:37 AM Performed by: Gar Gibbon Pre-anesthesia Checklist: Patient identified, Emergency Drugs available, Suction available and Patient being monitored Patient Re-evaluated:Patient Re-evaluated prior to inductionOxygen Delivery Method: Circle System Utilized Preoxygenation: Pre-oxygenation with 100% oxygen Intubation Type: IV induction Ventilation: Mask ventilation without difficulty LMA: LMA inserted LMA Size: 4.0 Number of attempts: 1 Airway Equipment and Method: bite block Placement Confirmation: positive ETCO2 Tube secured with: Tape Dental Injury: Teeth and Oropharynx as per pre-operative assessment

## 2012-05-20 NOTE — Op Note (Signed)
05/20/2012  8:34 AM  PATIENT:  Meghan Solis    PRE-OPERATIVE DIAGNOSIS:  RIGHT KNEE LATERAL MENISCAL TEAR  POST-OPERATIVE DIAGNOSIS:  Same  PROCEDURE:  KNEE ARTHROSCOPY WITH MEDIAL AND LATERAL MENISCECTOMY, CHONDROPLASTY OF THE MEDIAL FEMORAL CONDYLE, LATERAL FEMORAL CONDYLE AND FEMORAL TROCHLEA  SURGEON:  Eulas Post, MD  PHYSICIAN ASSISTANT: Janace Litten, OPA-C, present and scrubbed throughout the case, critical for completion in a timely fashion, and for retraction, instrumentation, and closure.  ANESTHESIA:   General  PREOPERATIVE INDICATIONS:  Meghan Solis is a  41 y.o. female with a diagnosis of RIGHT KNEE LATERAL MENISCAL TEAR who failed conservative measures and elected for surgical management.    The risks benefits and alternatives were discussed with the patient preoperatively including but not limited to the risks of infection, bleeding, nerve injury, cardiopulmonary complications, the need for revision surgery, among others, and the patient was willing to proceed.  OPERATIVE IMPLANTS: None  OPERATIVE FINDINGS: There was extensive uncontained grade 3 lesions on the medial femoral condyle, as well as some on the lateral side, and a smaller lesion, but definitely present on the femoral trochlea. The medial meniscus had some tearing in the central portion of the posterior horn around to the body. The lateral meniscus had extensive tear in the anterior horn. The anterior cruciate ligament did have mucoid degeneration. The patellofemoral joint had degenerative changes as well. There was no exposed bone, but extensive grade 3 changes.  OPERATIVE PROCEDURE: The patient is brought to the operating room placed in supine position. General anesthesia was administered. The right lower extremity was prepped and draped in usual sterile fashion. Diagnostic arthroscopy was carried out the above-named findings. The arthroscopic shaver was used to debride the femoral trochlea.  The medial  meniscus was debrided with the arthroscopic biter and an endoscopic shaver.  The lateral meniscus was debrided with the arthroscopic shaver as well as a biter, particularly around the anterior horn. I did debride a small portion of undersurface tearing of the posterior horn as well. The arthroscopic ArthroCare was used to achieve cautery of the fat pad over the anterior horn.  The instruments were removed, and the knee was drained and injected and the portals closed with Monocryl followed by Steri-Strips and sterile gauze. She tolerated this well with no complications.

## 2012-05-20 NOTE — H&P (Signed)
PREOPERATIVE H&P  Chief Complaint: RIGHT KNEE: TEAR MENISCUS LATERAL/ANTHORN/POSTHORN 836.1  HPI: Meghan Solis is a 41 y.o. female who presents for preoperative history and physical with a diagnosis of RIGHT KNEE: TEAR MENISCUS LATERAL/ANTHORN/POSTHORN 836.1. Symptoms are rated as moderate to severe, and have been worsening.  This is significantly impairing activities of daily living.  She has elected for surgical management. She's had injections as well as activity modification and anti-inflammatories without relief.  Past Medical History  Diagnosis Date  . Fibromyalgia   . Thalassanemia    Past Surgical History  Procedure Date  . Knee surgery     L knee  . Laparoscopic gastric banding   . Dilation and curettage of uterus    History   Social History  . Marital Status: Single    Spouse Name: N/A    Number of Children: N/A  . Years of Education: N/A   Social History Main Topics  . Smoking status: Never Smoker   . Smokeless tobacco: None  . Alcohol Use: Yes     Comment: occasionally  . Drug Use: No  . Sexually Active:    Other Topics Concern  . None   Social History Narrative  . None   History reviewed. No pertinent family history. Allergies  Allergen Reactions  . Sulfa Antibiotics Swelling   Prior to Admission medications   Medication Sig Start Date End Date Taking? Authorizing Provider  cetirizine (ZYRTEC) 10 MG tablet Take 10 mg by mouth daily as needed. For allergies   Yes Historical Provider, MD  ferrous sulfate 325 (65 FE) MG tablet Take 1 tablet (325 mg total) by mouth daily. 03/18/12 03/18/13 Yes Olivia Mackie, MD  gabapentin (NEURONTIN) 800 MG tablet Take 800 mg by mouth 3 (three) times daily.   Yes Historical Provider, MD  norethindrone-ethinyl estradiol (NECON,BREVICON,MODICON) 0.5-35 MG-MCG tablet Take 1 tablet by mouth daily.   Yes Historical Provider, MD  oxyCODONE (OXYCONTIN) 10 MG 12 hr tablet Take 10 mg by mouth every 12 (twelve) hours. For pain   Yes  Historical Provider, MD  pantoprazole (PROTONIX) 20 MG tablet Take 2 tablets (40 mg total) by mouth daily. 03/18/12 03/18/13 Yes Olivia Mackie, MD  traMADol (ULTRAM) 50 MG tablet Take 50 mg by mouth 3 (three) times daily.   Yes Historical Provider, MD  ibuprofen (ADVIL,MOTRIN) 600 MG tablet Take 600 mg by mouth every 6 (six) hours as needed. For pain    Historical Provider, MD     Positive ROS: All other systems have been reviewed and were otherwise negative with the exception of those mentioned in the HPI and as above.  Physical Exam: General: Alert, no acute distress Cardiovascular: No pedal edema Respiratory: No cyanosis, no use of accessory musculature GI: No organomegaly, abdomen is soft and non-tender Skin: No lesions in the area of chief complaint Neurologic: Sensation intact distally Psychiatric: Patient is competent for consent with normal mood and affect Lymphatic: No axillary or cervical lymphadenopathy  MUSCULOSKELETAL: Right knee has positive lateral joint line pain, as well as pain somewhat diffusely. Range of motion from 0 to 120.  Assessment: RIGHT KNEE: TEAR MENISCUS LATERAL/ANTHORN/POSTHORN 836.1  Plan: Plan for Procedure(s): KNEE ARTHROSCOPY WITH LATERAL MENISECTOMY  The risks benefits and alternatives were discussed with the patient including but not limited to the risks of nonoperative treatment, versus surgical intervention including infection, bleeding, nerve injury,  blood clots, cardiopulmonary complications, morbidity, mortality, among others, and they were willing to proceed. We also discussed the risks of  incomplete relief of symptoms, progression of arthritis, need for future arthroplasty.  Quindarrius Joplin P, MD Cell [phone] Pager [phone]  05/20/2012 7:23 AM

## 2012-06-01 ENCOUNTER — Ambulatory Visit (HOSPITAL_COMMUNITY)
Admission: RE | Admit: 2012-06-01 | Discharge: 2012-06-01 | Disposition: A | Discharge status: Home / Self Care | Payer: Medicaid Other | Source: Ambulatory Visit | Attending: Internal Medicine | Admitting: Internal Medicine

## 2012-06-01 ENCOUNTER — Other Ambulatory Visit (HOSPITAL_COMMUNITY): Payer: Self-pay | Admitting: Orthopedic Surgery

## 2012-06-01 DIAGNOSIS — M79604 Pain in right leg: Secondary | ICD-10-CM

## 2012-06-01 DIAGNOSIS — M79609 Pain in unspecified limb: Secondary | ICD-10-CM | POA: Insufficient documentation

## 2012-06-01 NOTE — Progress Notes (Signed)
RLE venous duplex completed. 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 Meghan Solis

## 2012-09-09 ENCOUNTER — Other Ambulatory Visit (HOSPITAL_COMMUNITY): Payer: Self-pay | Admitting: Cardiology

## 2012-09-09 DIAGNOSIS — R609 Edema, unspecified: Secondary | ICD-10-CM

## 2012-09-23 ENCOUNTER — Other Ambulatory Visit (HOSPITAL_COMMUNITY): Payer: Self-pay | Admitting: Cardiology

## 2012-09-23 ENCOUNTER — Ambulatory Visit (HOSPITAL_COMMUNITY)
Admission: RE | Admit: 2012-09-23 | Discharge: 2012-09-23 | Disposition: A | Discharge status: Home / Self Care | Payer: Medicaid Other | Source: Ambulatory Visit | Attending: Cardiology | Admitting: Cardiology

## 2012-09-23 DIAGNOSIS — M7989 Other specified soft tissue disorders: Secondary | ICD-10-CM

## 2012-09-23 DIAGNOSIS — I82409 Acute embolism and thrombosis of unspecified deep veins of unspecified lower extremity: Secondary | ICD-10-CM | POA: Insufficient documentation

## 2012-09-23 DIAGNOSIS — I82401 Acute embolism and thrombosis of unspecified deep veins of right lower extremity: Secondary | ICD-10-CM

## 2012-09-23 DIAGNOSIS — I82403 Acute embolism and thrombosis of unspecified deep veins of lower extremity, bilateral: Secondary | ICD-10-CM

## 2012-09-23 DIAGNOSIS — R609 Edema, unspecified: Secondary | ICD-10-CM

## 2012-09-23 NOTE — Progress Notes (Signed)
Right Lower Extremity Venous Duplex completed.  Negative for DVT.  Meghan Solis

## 2012-09-28 ENCOUNTER — Other Ambulatory Visit (HOSPITAL_COMMUNITY): Payer: Self-pay | Admitting: Cardiology

## 2012-09-28 DIAGNOSIS — I872 Venous insufficiency (chronic) (peripheral): Secondary | ICD-10-CM

## 2012-09-30 ENCOUNTER — Encounter (HOSPITAL_COMMUNITY): Payer: Medicaid Other

## 2012-10-13 ENCOUNTER — Ambulatory Visit (HOSPITAL_COMMUNITY)
Admission: RE | Admit: 2012-10-13 | Discharge: 2012-10-13 | Disposition: A | Discharge status: Home / Self Care | Payer: Medicaid Other | Source: Ambulatory Visit | Attending: Cardiology | Admitting: Cardiology

## 2012-10-13 DIAGNOSIS — I872 Venous insufficiency (chronic) (peripheral): Secondary | ICD-10-CM | POA: Insufficient documentation

## 2012-10-13 NOTE — Progress Notes (Signed)
Venous Duplex Lower Ext. Completed. Meghan Solis D  

## 2012-11-03 ENCOUNTER — Other Ambulatory Visit: Payer: Medicaid Other | Admitting: *Deleted

## 2012-11-03 DIAGNOSIS — D649 Anemia, unspecified: Secondary | ICD-10-CM

## 2012-11-04 ENCOUNTER — Inpatient Hospital Stay (HOSPITAL_COMMUNITY)
Admission: AD | Admit: 2012-11-04 | Discharge: 2012-11-04 | Disposition: A | Discharge status: Home / Self Care | Payer: Medicaid Other | Source: Ambulatory Visit | Attending: Obstetrics | Admitting: Obstetrics

## 2012-11-04 ENCOUNTER — Encounter (HOSPITAL_COMMUNITY): Payer: Self-pay | Admitting: *Deleted

## 2012-11-04 ENCOUNTER — Observation Stay (HOSPITAL_BASED_OUTPATIENT_CLINIC_OR_DEPARTMENT_OTHER)
Admission: EM | Admit: 2012-11-04 | Discharge: 2012-11-05 | Disposition: A | Discharge status: Home / Self Care | Payer: Medicaid Other | Source: Home / Self Care | Attending: Emergency Medicine | Admitting: Emergency Medicine

## 2012-11-04 ENCOUNTER — Encounter (HOSPITAL_COMMUNITY): Payer: Self-pay | Admitting: Emergency Medicine

## 2012-11-04 DIAGNOSIS — D649 Anemia, unspecified: Secondary | ICD-10-CM

## 2012-11-04 DIAGNOSIS — S83249D Other tear of medial meniscus, current injury, unspecified knee, subsequent encounter: Secondary | ICD-10-CM

## 2012-11-04 DIAGNOSIS — R0602 Shortness of breath: Secondary | ICD-10-CM

## 2012-11-04 DIAGNOSIS — D569 Thalassemia, unspecified: Secondary | ICD-10-CM

## 2012-11-04 DIAGNOSIS — S83289D Other tear of lateral meniscus, current injury, unspecified knee, subsequent encounter: Secondary | ICD-10-CM

## 2012-11-04 DIAGNOSIS — R42 Dizziness and giddiness: Secondary | ICD-10-CM

## 2012-11-04 DIAGNOSIS — R5383 Other fatigue: Secondary | ICD-10-CM

## 2012-11-04 HISTORY — DX: Acute embolism and thrombosis of unspecified deep veins of unspecified lower extremity: I82.409

## 2012-11-04 LAB — CBC
Hemoglobin: 6 g/dL — CL (ref 12.0–15.0)
RBC: 3.91 MIL/uL (ref 3.87–5.11)
WBC: 5.5 10*3/uL (ref 4.0–10.5)

## 2012-11-04 LAB — BASIC METABOLIC PANEL
CO2: 27 mEq/L (ref 19–32)
Chloride: 102 mEq/L (ref 96–112)
Glucose, Bld: 90 mg/dL (ref 70–99)
Potassium: 3.1 mEq/L — ABNORMAL LOW (ref 3.5–5.1)
Sodium: 138 mEq/L (ref 135–145)

## 2012-11-04 LAB — CBC WITH DIFFERENTIAL/PLATELET
Basophils Absolute: 0 10*3/uL (ref 0.0–0.1)
Eosinophils Relative: 6 % — ABNORMAL HIGH (ref 0–5)
Lymphocytes Relative: 36 % (ref 12–46)
Lymphocytes Relative: 44 % (ref 12–46)
Lymphs Abs: 2.5 10*3/uL (ref 0.7–4.0)
MCV: 58.2 fL — ABNORMAL LOW (ref 78.0–100.0)
Neutro Abs: 3 10*3/uL (ref 1.7–7.7)
Platelets: 363 10*3/uL (ref 150–400)
Platelets: 262 10*3/uL (ref 150–400)
RBC: 4.19 MIL/uL (ref 3.87–5.11)
RDW: 20.2 % — ABNORMAL HIGH (ref 11.5–15.5)
WBC: 5.9 10*3/uL (ref 4.0–10.5)
WBC: 5.6 10*3/uL (ref 4.0–10.5)

## 2012-11-04 LAB — PREPARE RBC (CROSSMATCH)

## 2012-11-04 NOTE — ED Provider Notes (Signed)
History     CSN: [id number]  Arrival date & time 11/04/12  2135   First MD Initiated Contact with Patient 11/04/12 2147      Chief Complaint  Patient presents with  . needs transfusion     (Consider location/radiation/quality/duration/timing/severity/associated sxs/prior treatment) The history is provided by the patient and medical records. No language interpreter was used.    Meghan Solis is a 42 y.o. female  with a hx of LC and anemia presents to the Emergency Department complaining of gradual, persistent, progressively worsening breast breath, fatigue, dizziness onset 4 months ago. Patient was seen by her oncologist yesterday and she mentioned to the symptoms. Gynecology ran a CBC and the patient was found to be severely anemic with a hemoglobin of 6.4. Patient states this is chronically where she runs however the symptoms have been progressive over the last several months. She states that time she is so tired she has to lay down and take a nap on her drive home from work.. States nothing makes it better and is makes it worse. Patient denies fever, chills, headache, neck pain, chest pain, abdominal pain, nausea, vomiting, diarrhea,, dysuria, hematuria. Patient denies blood in urine or bowel. She denies heavy menstrual cycles. Patient also denies hematemesis.   Past Medical History  Diagnosis Date  . Fibromyalgia   . Thalassanemia   . Medial meniscus tear, right knee 05/20/2012  . Lateral meniscus tear 05/20/2012  . DVT (deep venous thrombosis)     Past Surgical History  Procedure Laterality Date  . Knee surgery      L knee  . Laparoscopic gastric banding    . Dilation and curettage of uterus    . Knee arthroscopy with lateral menisectomy  05/20/2012    Procedure: KNEE ARTHROSCOPY WITH LATERAL MENISECTOMY;  Surgeon: Eulas Post, MD;  Location: [facility];  Service: Orthopedics;  Laterality: Right;  RIGHT KNEE ARTHROSCOPY LATERAL AND MEDIAL MENISCECTOMY   .  Fatty tumor removed    . Hernia repair      Family History  Problem Relation Age of Onset  . Diabetes Mother   . Cancer Father     History  Substance Use Topics  . Smoking status: Never Smoker   . Smokeless tobacco: Not on file  . Alcohol Use: Yes     Comment: occasionally    OB History   Grav Para Term Preterm Abortions TAB SAB Ect Mult Living   3 2 2  1  1   2       Review of Systems  Constitutional: Positive for fatigue. Negative for fever, diaphoresis, appetite change and unexpected weight change.  HENT: Negative for mouth sores and neck stiffness.   Eyes: Negative for visual disturbance.  Respiratory: Negative for cough, chest tightness, shortness of breath and wheezing.   Cardiovascular: Negative for chest pain.  Gastrointestinal: Negative for nausea, vomiting, abdominal pain, diarrhea and constipation.  Endocrine: Negative for polydipsia, polyphagia and polyuria.  Genitourinary: Negative for dysuria, urgency, frequency, hematuria and menstrual problem.  Musculoskeletal: Positive for myalgias. Negative for back pain.  Skin: Negative for rash.  Allergic/Immunologic: Negative for immunocompromised state.  Neurological: Positive for weakness and light-headedness. Negative for syncope and headaches.  Hematological: Does not bruise/bleed easily.  Psychiatric/Behavioral: Negative for sleep disturbance. The patient is not nervous/anxious.     Allergies  Sulfa antibiotics  Home Medications   Current Outpatient Rx  Name  Route  Sig  Dispense  Refill  . ferrous sulfate 325 (65  FE) MG tablet   Oral   Take 1 tablet (325 mg total) by mouth daily.   30 tablet   0   . gabapentin (NEURONTIN) 800 MG tablet   Oral   Take 800 mg by mouth 3 (three) times daily.         Marland Kitchen ibuprofen (ADVIL,MOTRIN) 600 MG tablet   Oral   Take 600 mg by mouth every 8 (eight) hours as needed for pain.         . Multiple Vitamin (MULTIVITAMIN WITH MINERALS) TABS   Oral   Take 1 tablet  by mouth daily.         Marland Kitchen oxyCODONE (OXYCONTIN) 10 MG 12 hr tablet   Oral   Take 10 mg by mouth every 12 (twelve) hours as needed for pain. For pain         . traMADol (ULTRAM) 50 MG tablet   Oral   Take 50 mg by mouth 3 (three) times daily. scheduled           BP 119/76  Pulse 64  Temp(Src) 98.4 F (36.9 C) (Oral)  Resp 18  SpO2 100%  LMP 10/15/2012  Physical Exam  Nursing note and vitals reviewed. Constitutional: She is oriented to person, place, and time. She appears well-developed and well-nourished. No distress.  HENT:  Head: Normocephalic and atraumatic.  Right Ear: Tympanic membrane, external ear and ear canal normal.  Left Ear: Tympanic membrane, external ear and ear canal normal.  Nose: Nose normal. No mucosal edema or rhinorrhea.  Mouth/Throat: Uvula is midline, oropharynx is clear and moist and mucous membranes are normal. Mucous membranes are not dry and not cyanotic. No oropharyngeal exudate, posterior oropharyngeal edema, posterior oropharyngeal erythema or tonsillar abscesses.  Pale mucus membranes  Eyes: Conjunctivae and EOM are normal. Pupils are equal, round, and reactive to light. No scleral icterus.  Neck: Normal range of motion. Neck supple.  Cardiovascular: Normal rate, regular rhythm, normal heart sounds and intact distal pulses.   Pulmonary/Chest: Effort normal and breath sounds normal. No respiratory distress. She has no wheezes. She has no rales. She exhibits no tenderness.  Abdominal: Soft. Bowel sounds are normal. She exhibits no distension and no mass. There is no tenderness. There is no rebound and no guarding.  Musculoskeletal: Normal range of motion. She exhibits no edema and no tenderness.  Lymphadenopathy:    She has no cervical adenopathy.  Neurological: She is alert and oriented to person, place, and time. She exhibits normal muscle tone. Coordination normal.  Speech is clear and goal oriented Moves extremities without ataxia  Skin:  Skin is warm and dry. She is not diaphoretic. No erythema. There is pallor.  Psychiatric: She has a normal mood and affect. Her behavior is normal.    ED Course  Procedures (including critical care time)  Labs Reviewed  CBC WITH DIFFERENTIAL - Abnormal; Notable for the following:    Hemoglobin 6.4 (*)    HCT 24.4 (*)    MCV 58.2 (*)    MCH 15.3 (*)    MCHC 26.2 (*)    RDW 20.6 (*)    Neutrophils Relative 42 (*)    Eosinophils Relative 6 (*)    All other components within normal limits  BASIC METABOLIC PANEL - Abnormal; Notable for the following:    Potassium 3.1 (*)    All other components within normal limits  RETICULOCYTES  VITAMIN B12  FOLATE  IRON AND TIBC  FERRITIN  PREPARE RBC (CROSSMATCH)  TYPE AND SCREEN  ABO/RH   No results found.   1. Anemia   2. Thalassemia       MDM  Gilford Rile presents with symptomatic anemia hemoglobin of 6.4 and hematocrit of 24.4. Anemia panel pending and patient has been typed and screened for blood.  Will hold transfusion until anemia panel has resulted. Discussed with [facility] hospitalist who will admit for overnight observation and transfusion.  Is alert, oriented, nontoxic, nonseptic appearing. Vital signs stable; no known source for blood loss.       Dahlia Client Omar Orrego, PA-C 11/05/12 [phone]

## 2012-11-04 NOTE — MAU Note (Signed)
Pt D/C with Family.  Family member to drive pt to [facility] Emergency Department for further evaluation.

## 2012-11-04 NOTE — MAU Provider Note (Signed)
History     CSN: [id number]  Arrival date and time: 11/04/12 1920   None     Chief Complaint  Patient presents with  . Fatigue   HPI  Meghan Solis is a 42 y.o. [id number] who is here today with anemia. She states that Dr. Tamela Oddi asked her to come in and be seen. She states that she has been feeling very tired. She has had to pull over while driving and take a nap in a parking lot. She states that she has been very sleepy and dizzy.  She denies any excessive vaginal bleeding, rectal bleeding, black stools or vomiting blood.  She states her LMP was 10/11/12. She reports a history of thalassemia.   Past Medical History  Diagnosis Date  . Fibromyalgia   . Thalassanemia   . Medial meniscus tear, right knee 05/20/2012  . Lateral meniscus tear 05/20/2012  . DVT (deep venous thrombosis)     Past Surgical History  Procedure Laterality Date  . Knee surgery      L knee  . Laparoscopic gastric banding    . Dilation and curettage of uterus    . Knee arthroscopy with lateral menisectomy  05/20/2012    Procedure: KNEE ARTHROSCOPY WITH LATERAL MENISECTOMY;  Surgeon: Eulas Post, MD;  Location: [facility];  Service: Orthopedics;  Laterality: Right;  RIGHT KNEE ARTHROSCOPY LATERAL AND MEDIAL MENISCECTOMY   . Fatty tumor removed    . Hernia repair      Family History  Problem Relation Age of Onset  . Diabetes Mother   . Cancer Father     History  Substance Use Topics  . Smoking status: Never Smoker   . Smokeless tobacco: Not on file  . Alcohol Use: Yes     Comment: occasionally    Allergies:  Allergies  Allergen Reactions  . Sulfa Antibiotics Swelling    Prescriptions prior to admission  Medication Sig Dispense Refill  . ferrous sulfate 325 (65 FE) MG tablet Take 1 tablet (325 mg total) by mouth daily.  30 tablet  0  . gabapentin (NEURONTIN) 800 MG tablet Take 800 mg by mouth 3 (three) times daily.      Marland Kitchen ibuprofen (ADVIL,MOTRIN) 600 MG tablet Take 600  mg by mouth every 8 (eight) hours as needed for pain.      . Multiple Vitamin (MULTIVITAMIN WITH MINERALS) TABS Take 1 tablet by mouth daily.      Marland Kitchen oxyCODONE (OXYCONTIN) 10 MG 12 hr tablet Take 10 mg by mouth every 12 (twelve) hours as needed for pain. For pain      . traMADol (ULTRAM) 50 MG tablet Take 50 mg by mouth 3 (three) times daily.        Review of Systems  Constitutional: Positive for malaise/fatigue. Negative for fever.  Respiratory: Positive for shortness of breath (feels like she is "out of shape" due to lack of exercise. ).   Cardiovascular: Negative for chest pain.  Gastrointestinal: Negative for nausea, vomiting, abdominal pain, diarrhea and constipation.  Genitourinary: Negative for dysuria, urgency and frequency.  Musculoskeletal: Negative for myalgias.  Neurological: Positive for dizziness and weakness. Negative for headaches.   Physical Exam   Blood pressure 108/73, pulse 86, temperature 98.3 F (36.8 C), resp. rate 20, height 5\' 7"  (1.702 m), weight 84.55 kg (186 lb 6.4 oz), last menstrual period 10/15/2012, SpO2 100.00%.  B/P: Lying down: 122/75 mmHg, Pulse: 75          Sitting: 114/74 mmHg,  Pulse: 75          Standing: 108/73 mmHg, Pulse: 86    Physical Exam  Nursing note and vitals reviewed. Constitutional: She is oriented to person, place, and time. She appears well-developed and well-nourished. No distress.  Cardiovascular: Normal rate.   Respiratory: Effort normal.  GI: Soft.  Neurological: She is alert and oriented to person, place, and time.  Skin: Skin is warm and dry.  Psychiatric: She has a normal mood and affect.    2100: Spoke with Dr. Clearance Coots. He is not comfortable or familiar with managing thalassemia. He is recommending having the patient evaluated  at [facility] or [facility].  2110: Spoke with Dr. Lynelle Doctor at [facility] what he would normally do in this situation is he would transfuse the patient and then send for an outpatient Heme consult.  2111: Spoke with Dr.  Clearance Coots again. He wants to the patient to be seen in the ED, and not here.  2115: Spoke with Dr. Lynelle Doctor, ok to transport to [facility] via personal car.   MAU Course  Procedures  Results for orders placed during the hospital encounter of 11/04/12 (from the past 24 hour(s))  CBC     Status: Abnormal   Collection Time    11/04/12  7:45 PM      Result Value Range   WBC 5.5  4.0 - 10.5 K/uL   RBC 3.91  3.87 - 5.11 MIL/uL   Hemoglobin 6.0 (*) 12.0 - 15.0 g/dL   HCT 21.3 (*) 08.6 - 57.8 %   MCV 59.1 (*) 78.0 - 100.0 fL   MCH 15.3 (*) 26.0 - 34.0 pg   MCHC 26.0 (*) 30.0 - 36.0 g/dL   RDW 46.9 (*) 62.9 - 52.8 %   Platelets 226  150 - 400 K/uL     Assessment and Plan   1. Anemia    Go to [facility] now via personal car for further treatment and evaluation.   [name] 11/04/2012, 8:10 PM

## 2012-11-04 NOTE — ED Notes (Signed)
Pt sent to [facility] today by GYN for low H & H, pt sent here for transfusion. Pt states she has chronic low HGB, pt does states she has been feeling weak and SHOB.

## 2012-11-04 NOTE — MAU Note (Signed)
The doctor called and said to meet her here because my blood count is really low. I might need a transfusion. I'm very sleepy and fall asleep easily. I'm always in pain due to fibromyalgia but no new pain

## 2012-11-05 DIAGNOSIS — R42 Dizziness and giddiness: Secondary | ICD-10-CM

## 2012-11-05 DIAGNOSIS — R5383 Other fatigue: Secondary | ICD-10-CM

## 2012-11-05 DIAGNOSIS — D649 Anemia, unspecified: Secondary | ICD-10-CM

## 2012-11-05 DIAGNOSIS — R0602 Shortness of breath: Secondary | ICD-10-CM

## 2012-11-05 DIAGNOSIS — D569 Thalassemia, unspecified: Secondary | ICD-10-CM

## 2012-11-05 LAB — CBC
Hemoglobin: 8.1 g/dL — ABNORMAL LOW (ref 12.0–15.0)
Platelets: 230 10*3/uL (ref 150–400)
RBC: 4.5 MIL/uL (ref 3.87–5.11)
WBC: 5.1 10*3/uL (ref 4.0–10.5)

## 2012-11-05 LAB — IRON AND TIBC
Iron: 11 ug/dL — ABNORMAL LOW (ref 42–135)
Saturation Ratios: 3 % — ABNORMAL LOW (ref 20–55)
TIBC: 407 ug/dL (ref 250–470)
UIBC: 396 ug/dL (ref 125–400)

## 2012-11-05 LAB — BASIC METABOLIC PANEL
CO2: 28 mEq/L (ref 19–32)
Calcium: 8.6 mg/dL (ref 8.4–10.5)
GFR calc non Af Amer: >90 mL/min (ref >90)
Potassium: 3.3 mEq/L — ABNORMAL LOW (ref 3.5–5.1)
Sodium: 139 mEq/L (ref 135–145)

## 2012-11-05 LAB — PREPARE RBC (CROSSMATCH)

## 2012-11-05 LAB — FOLATE: Folate: 15.6 ng/mL

## 2012-11-05 MED ORDER — ADULT MULTIVITAMIN W/MINERALS CH
1.0000 | ORAL_TABLET | Freq: Every day | ORAL | Status: DC
  Administered 2012-11-05: 1 via ORAL
  Filled 2012-11-05: qty 1

## 2012-11-05 MED ORDER — POTASSIUM CHLORIDE CRYS ER 20 MEQ PO TBCR
40.0000 meq | EXTENDED_RELEASE_TABLET | Freq: Once | ORAL | Status: AC
  Administered 2012-11-05: 40 meq via ORAL
  Filled 2012-11-05 (×2): qty 2

## 2012-11-05 MED ORDER — GABAPENTIN 400 MG PO CAPS
800.0000 mg | ORAL_CAPSULE | Freq: Three times a day (TID) | ORAL | Status: DC
  Administered 2012-11-05: 800 mg via ORAL
  Filled 2012-11-05 (×3): qty 2

## 2012-11-05 MED ORDER — ACETAMINOPHEN 650 MG RE SUPP: 650.0000 mg | Freq: Four times a day (QID) | RECTAL | Status: DC | PRN

## 2012-11-05 MED ORDER — FERROUS SULFATE 325 (65 FE) MG PO TABS
325.0000 mg | ORAL_TABLET | Freq: Every day | ORAL | Status: DC
  Administered 2012-11-05: 325 mg via ORAL
  Filled 2012-11-05 (×2): qty 1

## 2012-11-05 MED ORDER — SODIUM CHLORIDE 0.9 % IV SOLN: 250.0000 mL | INTRAVENOUS | Status: DC | PRN

## 2012-11-05 MED ORDER — SODIUM CHLORIDE 0.9 % IJ SOLN
3.0000 mL | Freq: Two times a day (BID) | INTRAMUSCULAR | Status: DC
  Administered 2012-11-05: 3 mL via INTRAVENOUS

## 2012-11-05 MED ORDER — SODIUM CHLORIDE 0.9 % IJ SOLN
3.0000 mL | Freq: Two times a day (BID) | INTRAMUSCULAR | Status: DC
  Administered 2012-11-05 (×2): 3 mL via INTRAVENOUS

## 2012-11-05 MED ORDER — SODIUM CHLORIDE 0.9 % IV SOLN: INTRAVENOUS | Status: DC

## 2012-11-05 MED ORDER — ACETAMINOPHEN 325 MG PO TABS: 650.0000 mg | ORAL_TABLET | Freq: Four times a day (QID) | ORAL | Status: DC | PRN

## 2012-11-05 MED ORDER — SODIUM CHLORIDE 0.9 % IJ SOLN: 3.0000 mL | INTRAMUSCULAR | Status: DC | PRN

## 2012-11-05 MED ORDER — TRAMADOL HCL 50 MG PO TABS
50.0000 mg | ORAL_TABLET | Freq: Three times a day (TID) | ORAL | Status: DC
  Administered 2012-11-05: 50 mg via ORAL
  Filled 2012-11-05: qty 1

## 2012-11-05 NOTE — Consult Note (Signed)
#   [id number] is consult note.  Meghan E.  Hebrews 12:12

## 2012-11-05 NOTE — Discharge Summary (Signed)
Physician Discharge Summary  Meghan Solis YNW:[id number] DOB: 11-24-1970 DOA: 11/04/2012  PCP: Billee Cashing, MD  Admit date: 11/04/2012 Discharge date: 11/05/2012  Time spent: 50* minutes  Recommendations for Outpatient Follow-up:  1. Follow up PCP in 2 weeks 2. Follow up Hematology as outpatient  Discharge Diagnoses:  Principal Problem:   Anemia Active Problems:   Thalassemia   Fatigue   Dizziness   SOB (shortness of breath)   Discharge Condition: Stable  Diet recommendation: regular diet  Filed Weights   11/05/12 0224  Weight: 84.3 kg (185 lb 13.6 oz)    History of present illness:  42 year old African American woman who has a past medical history that is only significant for thalassemia. She states that for the past 2 weeks she has become increasingly fatigued, has had to take frequent naps during the day. She also describes some dizziness when standing up. She has also been somewhat sort of breath with exertion and she has been attributing this to lack of exercise and being "out of shape". She denies menorrhagia, hematemesis, hematochezia, melena. She had some blood work done by her gynecologist and her hemoglobin resulted at 6.0 so she was asked to come to the emergency department for evaluation. We have been asked to admit her for further management.   Hospital Course:   Symptomatic anemia Resolved,Hb is   s/p two units blood transfusion. Has h/o Thalassemia, will need to be followed by hematology as outpatient Her iron level is 11, will continue on ferrous sulfate. Patient was seen by Hematology Dr Myna Hidalgo, and she will be followed as outpatient for IV iron.  Hypokalemia Potassium is 3.3, will give one dose of Kdur 40 meq po x1  Procedures:  None  Consultations:  None  Discharge Exam: Filed Vitals:   11/05/12 0224 11/05/12 0335 11/05/12 0428 11/05/12 0543  BP: 115/79 111/70 101/61 98/70  Pulse: 66 62 60 65  Temp: 98.1 F (36.7 C) 97.7 F (36.5  C) 97.7 F (36.5 C) 97.4 F (36.3 C)  TempSrc: Oral Oral Oral Oral  Resp: 18 18 18 18   Height: 5\' 7"  (1.702 m)     Weight: 84.3 kg (185 lb 13.6 oz)     SpO2: 100%  100%     General: Appear in no acute distress Cardiovascular: s1s2 RRR Respiratory: Clear bilaterally Ext: No edema  Discharge Instructions  Discharge Orders   Future Orders Complete By Expires     Diet - low sodium heart healthy  As directed     Discharge instructions  As directed     Comments:      Follow up Hematology as outpatient    Increase activity slowly  As directed         Medication List    TAKE these medications       ferrous sulfate 325 (65 FE) MG tablet  Take 1 tablet (325 mg total) by mouth daily.     gabapentin 800 MG tablet  Commonly known as:  NEURONTIN  Take 800 mg by mouth 3 (three) times daily.     ibuprofen 600 MG tablet  Commonly known as:  ADVIL,MOTRIN  Take 600 mg by mouth every 8 (eight) hours as needed for pain.     multivitamin with minerals Tabs  Take 1 tablet by mouth daily.     oxyCODONE 10 MG 12 hr tablet  Commonly known as:  OXYCONTIN  Take 10 mg by mouth every 12 (twelve) hours as needed for pain. For pain  traMADol 50 MG tablet  Commonly known as:  ULTRAM  Take 50 mg by mouth 3 (three) times daily. scheduled          The results of significant diagnostics from this hospitalization (including imaging, microbiology, ancillary and laboratory) are listed below for reference.    Significant Diagnostic Studies: No results found.  Microbiology: No results found for this or any previous visit (from the past 240 hour(s)).   Labs: Basic Metabolic Panel:  Recent Labs Lab 11/04/12 2200 11/05/12 0751  NA 138 139  K 3.1* 3.3*  CL 102 104  CO2 27 28  GLUCOSE 90 88  BUN 7 7  CREATININE 0.67 0.65  CALCIUM 8.8 8.6   Liver Function Tests: No results found for this basename: AST, ALT, ALKPHOS, BILITOT, PROT, ALBUMIN,  in the last 168 hours No results  found for this basename: LIPASE, AMYLASE,  in the last 168 hours No results found for this basename: AMMONIA,  in the last 168 hours CBC:  Recent Labs Lab 11/03/12 1557 11/04/12 1945 11/04/12 2200 11/05/12 0751  WBC 5.9 5.5 5.6 5.1  NEUTROABS 3.0  --  2.3  --   HGB 6.5* 6.0* 6.4* 8.1*  HCT 24.7* 23.1* 24.4* 28.4*  MCV 58.1* 59.1* 58.2* 63.1*  PLT 363 226 262 230   Cardiac Enzymes: No results found for this basename: CKTOTAL, CKMB, CKMBINDEX, TROPONINI,  in the last 168 hours BNP: BNP (last 3 results) No results found for this basename: PROBNP,  in the last 8760 hours CBG: No results found for this basename: GLUCAP,  in the last 168 hours     Signed:  Jordie Skalsky S  [facility] 11/05/2012, 10:01 AM

## 2012-11-05 NOTE — ED Provider Notes (Signed)
Medical screening examination/treatment/procedure(s) were performed by non-physician practitioner and as supervising physician I was immediately available for consultation/collaboration.    Celene Kras, MD 11/05/12 [phone]

## 2012-11-05 NOTE — ED Notes (Signed)
Attempted to call report. No answer.

## 2012-11-05 NOTE — Progress Notes (Signed)
Utilization review completed.  P.J. Sheril Hammond,RN,BSN Case Manager [phone]  

## 2012-11-05 NOTE — H&P (Addendum)
[facility]          History and Physical    PCP:   Billee Cashing, MD   Chief Complaint:  Shortness of breath, fatigue, dizziness  HPI: Patient is a pleasant 42 year old African American woman who has a past medical history that is only significant for thalassemia. She states that for the past 2 weeks she has become increasingly fatigued, has had to take frequent naps during the day. She also describes some dizziness when standing up. She has also been somewhat sort of breath with exertion and she has been attributing this to lack of exercise and being "out of shape". She denies menorrhagia, hematemesis, hematochezia, melena. She had some blood work done by her gynecologist and her hemoglobin resulted at 6.0 so she was asked to come to the emergency department for evaluation. We have been asked to admit her for further management.  Allergies:   Allergies  Allergen Reactions  . Sulfa Antibiotics Swelling      Past Medical History  Diagnosis Date  . Fibromyalgia   . Thalassanemia   . Medial meniscus tear, right knee 05/20/2012  . Lateral meniscus tear 05/20/2012  . DVT (deep venous thrombosis)     Past Surgical History  Procedure Laterality Date  . Knee surgery      L knee  . Laparoscopic gastric banding    . Dilation and curettage of uterus    . Knee arthroscopy with lateral menisectomy  05/20/2012    Procedure: KNEE ARTHROSCOPY WITH LATERAL MENISECTOMY;  Surgeon: Eulas Post, MD;  Location: [facility];  Service: Orthopedics;  Laterality: Right;  RIGHT KNEE ARTHROSCOPY LATERAL AND MEDIAL MENISCECTOMY   . Fatty tumor removed    . Hernia repair      Prior to Admission medications   Medication Sig Start Date End Date Taking? Authorizing Provider  ferrous sulfate 325 (65 FE) MG tablet Take 1 tablet (325 mg total) by mouth daily. 03/18/12 03/18/13 Yes Olivia Mackie, MD  gabapentin (NEURONTIN) 800 MG tablet Take 800 mg by mouth 3 (three) times  daily.   Yes Historical Provider, MD  ibuprofen (ADVIL,MOTRIN) 600 MG tablet Take 600 mg by mouth every 8 (eight) hours as needed for pain.   Yes Historical Provider, MD  Multiple Vitamin (MULTIVITAMIN WITH MINERALS) TABS Take 1 tablet by mouth daily.   Yes Historical Provider, MD  oxyCODONE (OXYCONTIN) 10 MG 12 hr tablet Take 10 mg by mouth every 12 (twelve) hours as needed for pain. For pain   Yes Historical Provider, MD  traMADol (ULTRAM) 50 MG tablet Take 50 mg by mouth 3 (three) times daily. scheduled   Yes Historical Provider, MD    Social History:  reports that she has never smoked. She does not have any smokeless tobacco history on file. She reports that  drinks alcohol. She reports that she does not use illicit drugs.  Family History  Problem Relation Age of Onset  . Diabetes Mother   . Cancer Father     Review of Systems:  Constitutional: Denies fever, chills, diaphoresis, appetite change.  HEENT: Denies photophobia, eye pain, redness, hearing loss, ear pain, congestion, sore throat, rhinorrhea, sneezing, mouth sores, trouble swallowing, neck pain, neck stiffness and tinnitus.   Respiratory: Denies cough, chest tightness,  and wheezing.   Cardiovascular: Denies chest pain, palpitations and leg swelling.  Gastrointestinal: Denies nausea, vomiting, abdominal pain, diarrhea, constipation, blood in stool and abdominal distention.  Genitourinary: Denies dysuria, urgency, frequency, hematuria, flank pain  and difficulty urinating.  Musculoskeletal: Denies myalgias, back pain, joint swelling, arthralgias and gait problem.  Skin: Denies pallor, rash and wound.  Neurological: Denies  seizures, syncope, weakness, light-headedness, numbness and headaches.  Hematological: Denies adenopathy. Easy bruising, personal or family bleeding history  Psychiatric/Behavioral: Denies suicidal ideation, mood changes, confusion, nervousness, sleep disturbance and agitation   Physical Exam: Blood  pressure 119/76, pulse 64, temperature 98.4 F (36.9 C), temperature source Oral, resp. rate 18, last menstrual period 10/15/2012, SpO2 100.00%. General: Alert, awake, oriented x3, in no acute distress. Very pleasant and cooperative with exam. HEENT: Normocephalic, atraumatic, pupils equal round and reactive to light, extraocular movements intact, moist mucous membranes. Neck: Supple, no JVD, no lymphadenopathy, no bruits, no goiter. Cardiovascular: Regular rate and rhythm, no murmurs, rubs or gallops. Lungs: Clear to auscultation bilaterally. Abdomen: Soft, nontender, nondistended, positive bowel sounds, no masses or organomegaly noted. Extremities: No clubbing, cyanosis or edema, positive pedal pulses. Neurologic: Grossly intact and nonfocal.  Labs on Admission:  Results for orders placed during the hospital encounter of 11/04/12 (from the past 48 hour(s))  TYPE AND SCREEN     Status: None   Collection Time    11/04/12 10:00 PM      Result Value Range   ABO/RH(D) O POS     Antibody Screen NEG     Sample Expiration 11/07/2012     Unit Number [id number]     Blood Component Type RED CELLS,LR     Unit division 00     Status of Unit ISSUED     Transfusion Status OK TO TRANSFUSE     Crossmatch Result Compatible     Unit Number [id number]     Blood Component Type RED CELLS,LR     Unit division 00     Status of Unit ALLOCATED     Transfusion Status OK TO TRANSFUSE     Crossmatch Result Compatible    CBC WITH DIFFERENTIAL     Status: Abnormal   Collection Time    11/04/12 10:00 PM      Result Value Range   WBC 5.6  4.0 - 10.5 K/uL   RBC 4.19  3.87 - 5.11 MIL/uL   Hemoglobin 6.4 (*) 12.0 - 15.0 g/dL   Comment: CRITICAL RESULT CALLED TO, READ BACK BY AND VERIFIED WITH:     HAYDEN,R. RN @2312  11/04/12 WELLS,D.   HCT 24.4 (*) 36.0 - 46.0 %   MCV 58.2 (*) 78.0 - 100.0 fL   MCH 15.3 (*) 26.0 - 34.0 pg   MCHC 26.2 (*) 30.0 - 36.0 g/dL   RDW 24.4 (*) 01.0 - 27.2 %   Platelets 262   150 - 400 K/uL   Comment: REPEATED TO VERIFY     SPECIMEN CHECKED FOR CLOTS   Neutrophils Relative 42 (*) 43 - 77 %   Neutro Abs 2.3  1.7 - 7.7 K/uL   Lymphocytes Relative 44  12 - 46 %   Lymphs Abs 2.5  0.7 - 4.0 K/uL   Monocytes Relative 8  3 - 12 %   Monocytes Absolute 0.4  0.1 - 1.0 K/uL   Eosinophils Relative 6 (*) 0 - 5 %   Eosinophils Absolute 0.3  0.0 - 0.7 K/uL   Basophils Relative 1  0 - 1 %   Basophils Absolute 0.0  0.0 - 0.1 K/uL  BASIC METABOLIC PANEL     Status: Abnormal   Collection Time    11/04/12 10:00 PM  Result Value Range   Sodium 138  135 - 145 mEq/L   Potassium 3.1 (*) 3.5 - 5.1 mEq/L   Chloride 102  96 - 112 mEq/L   CO2 27  19 - 32 mEq/L   Glucose, Bld 90  70 - 99 mg/dL   BUN 7  6 - 23 mg/dL   Creatinine, Ser 4.09  0.50 - 1.10 mg/dL   Calcium 8.8  8.4 - 81.1 mg/dL   GFR calc non Af Amer >90  >90 mL/min   GFR calc Af Amer >90  >90 mL/min   Comment:            The eGFR has been calculated     using the CKD EPI equation.     This calculation has not been     validated in all clinical     situations.     eGFR's persistently     <90 mL/min signify     possible Chronic Kidney Disease.  ABO/RH     Status: None   Collection Time    11/04/12 10:00 PM      Result Value Range   ABO/RH(D) O POS    PREPARE RBC (CROSSMATCH)     Status: None   Collection Time    11/04/12 10:30 PM      Result Value Range   Order Confirmation ORDER PROCESSED BY BLOOD BANK    RETICULOCYTES     Status: None   Collection Time    11/04/12 11:30 PM      Result Value Range   Retic Ct Pct 0.7  0.4 - 3.1 %   RBC. 4.01  3.87 - 5.11 MIL/uL   Retic Count, Manual 28.1  19.0 - 186.0 K/uL    Radiological Exams on Admission: No results found.  Assessment/Plan Principal Problem:   Anemia Active Problems:   Thalassemia   Fatigue   Dizziness   SOB (shortness of breath)   Symptomatic Anemia -All of her symptoms can be attributed to her anemia. -Will admit and transfuse 2  units of PRBCs. -She can probably be discharged home in the morning. -Anemia panel has been drawn prior to transfusion and is currently pending. -Patient states that she has a history of thalassemia and has never seen a hematologist before. This can probably be accomplished as an outpatient. -Recheck CBC in the morning.  DVT prophylaxis -SCDs.  Time Spent on Admission: 70 minutes  HERNANDEZ ACOSTA,ESTELA [facility] Pager: [phone] 11/05/2012, 1:02 AM

## 2012-11-06 NOTE — Consult Note (Signed)
NAMEMARIYANNA, [name]               ACCOUNT NO.:  [id number]  MEDICAL RECORD NO.:  [id number]  LOCATION:  1413                         FACILITY:  [facility]  PHYSICIAN:  Josph Macho, M.D.  DATE OF BIRTH:  January 11, 1971  DATE OF CONSULTATION:  11/05/2012 DATE OF DISCHARGE:  11/05/2012                                CONSULTATION   REFERRING PHYSICIAN:  [name], MD.  REASON FOR CONSULTATION:  Microcytic anemia - alpha-thalassemia with iron deficiency.  HISTORY OF PRESENT ILLNESS:  [name] is a very charming 42 year old African American female.  She is originally from [place].  She has been down here for about a year and a half.  The patient says that she has "thalassemia."  She is not sure who else in the family has this.  She does have some heavy monthly cycles.  She has had a gastric bypass 2 years ago.  She did develop a DVT in the right lower leg.  This happened after she had a meniscus surgery.  This was in November, 2013.  She was on Coumadin for 3 months.  She says that her sister has had a lot of "blood clots."  She subsequently was admitted on the 25th.  She came in because of fatigue and weakness.  She was found have a hemoglobin of 6.  She had white cell count of 5.6, platelet count of 262,000.  Her hemoglobin was actually 6.4.  MCV is 58.  She did get I think 2 units of blood.  She is markedly iron deficient.  Her ferritin was 2 with an iron saturation of 3.  She has not got any IV iron.  What is most critical is the fact that her reticulocyte count is basically 0 when corrected for her hemoglobin.  She had a normal vitamin B12.  She does not have a hematologist.  She thought that hematologist might help her.  As such, I was asked to see her before she was discharged.  She does not crave ice.  She has no unusual taste for food.  There has been no weight loss or weight gain.  She has had no change in bowel or bladder habits.  She says that her monthly  cycles are more regular now.  She had electrolytes on admission.  Electrolytes showed normal BUN and creatinine.  PAST MEDICAL HISTORY:  Remarkable for, 1. Fibromyalgia. 2. History of DVT. 3. Right knee meniscal surgery. 4. Gastric banding.  ALLERGIES:  SULFA.  ADMISSION MEDICATIONS: 1. Iron sulfate 325 daily. 2. Neurontin 800 mg p.o. t.i.d. 3. Motrin as indicated. 4. OxyContin 10 mg p.o. q.12 hours. 5. Ultram 50 mg p.o. t.i.d. p.r.n.  SOCIAL HISTORY:  Negative for tobacco use.  There is no significant alcohol use.  She has no recreational drug use.  She has no occupational exposures.  FAMILY HISTORY:  Remarkable for father who died of lung cancer.  Again she is not sure who in her family has thalassemia or anemia.  REVIEW OF SYSTEMS:  As stated in the history of present illness.  PHYSICAL EXAMINATION:  GENERAL:  This is a well-developed, well- nourished [place] American female, in no obvious distress. VITAL SIGNS:  Temperature  of 97.4, pulse 65, respiratory rate 18, blood pressure 98/70. HEAD AND NECK:  Normocephalic and atraumatic skull.  There are no ocular or oral lesions.  There are no palpable cervical or supraclavicular lymph nodes.  She has pale conjunctiva.  She has no glossitis.  Thyroid is not palpable. LUNGS:  Clear bilaterally. CARDIAC:  Regular rate and rhythm with normal S1 and S2.  There are no murmurs, rubs, or bruits. ABDOMEN:  Soft with good bowel sounds.  There is no fluid wave.  She has no palpable abdominal mass.  No palpable hepatosplenomegaly. EXTREMITIES:  No clubbing, cyanosis, or edema. NEUROLOGICAL:  No focal neurological deficits. SKIN:  No rashes, ecchymosis, or petechia.  LABORATORY STUDIES:  White cell count is 5.1, hemoglobin 8.1, hematocrit 28.4, platelet count 230.  MCV is 63.  Peripheral blood smear shows a microcytic population of red blood cells. She does have some target cells.  There is rare schistocyte.  There is no nucleated  red blood cells.  There is no rouleaux formation.  She has no inclusion bodies.  Her white cells appear normal in morphology maturation.  There is no immature myeloid cells.  There are no hypersegmented polys.  I see no atypical lymphocytes.  Platelets are somewhat increased in number.  She has a few large platelets.  IMPRESSION:  [name] is a very charming 42 year old African American female with microcytic anemia.  I have to believe that this is mostly iron deficiency.  She has thalassemia, one will have to think that she has alpha- thalassemia.  Blood smear certainly looks like iron deficient as the major source of her anemia.  The fact that she absolutely has no reticulocyte count goes along with iron deficiency.  She is going to need IV iron.  We can do this in the office after she is discharged.  When she gets to the office, we will check her hemoglobin and electrophoresis.  I will have to be cognizant of this possible family history of thrombophilia.  It sounds like she had a postop thromboembolic event. She was on Coumadin for 3 months.  Again, we will have to be cautious of this.  We will get her into our office.  I did give her my business card.  She is awful nice.  There was fun talking to her.     Josph Macho, M.D.     PRE/MEDQ  D:  11/05/2012  T:  11/06/2012  Job:  [id number]  cc:   Lorelle Formosa, M.D. Fax: [phone]

## 2012-11-07 ENCOUNTER — Telehealth: Payer: Self-pay | Admitting: Hematology & Oncology

## 2012-11-07 NOTE — Telephone Encounter (Signed)
Pt aware of 5-2 appointment

## 2012-11-08 LAB — TYPE AND SCREEN
ABO/RH(D): O POS
Unit division: 0

## 2012-11-09 ENCOUNTER — Ambulatory Visit: Payer: Medicaid Other

## 2012-11-10 ENCOUNTER — Ambulatory Visit: Payer: Medicaid Other | Attending: Orthopedic Surgery | Admitting: Physical Therapy

## 2012-11-10 DIAGNOSIS — M25669 Stiffness of unspecified knee, not elsewhere classified: Secondary | ICD-10-CM | POA: Insufficient documentation

## 2012-11-10 DIAGNOSIS — R269 Unspecified abnormalities of gait and mobility: Secondary | ICD-10-CM | POA: Insufficient documentation

## 2012-11-10 DIAGNOSIS — IMO0001 Reserved for inherently not codable concepts without codable children: Secondary | ICD-10-CM | POA: Insufficient documentation

## 2012-11-10 DIAGNOSIS — M25569 Pain in unspecified knee: Secondary | ICD-10-CM | POA: Insufficient documentation

## 2012-11-11 ENCOUNTER — Telehealth: Payer: Self-pay | Admitting: Hematology & Oncology

## 2012-11-11 ENCOUNTER — Ambulatory Visit: Payer: Medicaid Other

## 2012-11-11 ENCOUNTER — Ambulatory Visit: Payer: Medicaid Other | Admitting: Hematology & Oncology

## 2012-11-11 ENCOUNTER — Other Ambulatory Visit: Payer: Medicaid Other | Admitting: Lab

## 2012-11-11 NOTE — Telephone Encounter (Signed)
Patient called and cx 11/11/12 and resch for 11/14/12

## 2012-11-14 ENCOUNTER — Other Ambulatory Visit (HOSPITAL_BASED_OUTPATIENT_CLINIC_OR_DEPARTMENT_OTHER): Payer: Medicaid Other | Admitting: Lab

## 2012-11-14 ENCOUNTER — Ambulatory Visit (HOSPITAL_BASED_OUTPATIENT_CLINIC_OR_DEPARTMENT_OTHER): Payer: Medicaid Other | Admitting: Hematology & Oncology

## 2012-11-14 ENCOUNTER — Ambulatory Visit (HOSPITAL_BASED_OUTPATIENT_CLINIC_OR_DEPARTMENT_OTHER): Payer: Medicaid Other

## 2012-11-14 VITALS — BP 105/60 | HR 73 | Temp 98.2°F | Resp 16 | Ht 67.0 in | Wt 187.0 lb

## 2012-11-14 DIAGNOSIS — D649 Anemia, unspecified: Secondary | ICD-10-CM

## 2012-11-14 DIAGNOSIS — D509 Iron deficiency anemia, unspecified: Secondary | ICD-10-CM

## 2012-11-14 DIAGNOSIS — D560 Alpha thalassemia: Secondary | ICD-10-CM

## 2012-11-14 DIAGNOSIS — IMO0001 Reserved for inherently not codable concepts without codable children: Secondary | ICD-10-CM

## 2012-11-14 DIAGNOSIS — D569 Thalassemia, unspecified: Secondary | ICD-10-CM

## 2012-11-14 LAB — CBC WITH DIFFERENTIAL (CANCER CENTER ONLY)
BASO%: 0.6 % (ref 0.0–2.0)
EOS%: 6.3 % (ref 0.0–7.0)
LYMPH%: 33.9 % (ref 14.0–48.0)
MCV: 65 fL — ABNORMAL LOW (ref 81–101)
MONO#: 0.4 10*3/uL (ref 0.1–0.9)
Platelets: 261 10*3/uL (ref 145–400)
RDW: 27.9 % — ABNORMAL HIGH (ref 11.1–15.7)
WBC: 5.4 10*3/uL (ref 3.9–10.0)

## 2012-11-14 LAB — IRON AND TIBC
%SAT: 5 % — ABNORMAL LOW (ref 20–55)
Iron: 20 ug/dL — ABNORMAL LOW (ref 42–145)

## 2012-11-14 LAB — FERRITIN: Ferritin: 1 ng/mL — ABNORMAL LOW (ref 10–291)

## 2012-11-14 LAB — CHCC SATELLITE - SMEAR

## 2012-11-14 LAB — TECHNOLOGIST REVIEW CHCC SATELLITE

## 2012-11-14 MED ORDER — SODIUM CHLORIDE 0.9 % IV SOLN
1020.0000 mg | Freq: Once | INTRAVENOUS | Status: AC
  Administered 2012-11-14: 1020 mg via INTRAVENOUS
  Filled 2012-11-14: qty 34

## 2012-11-14 MED ORDER — SODIUM CHLORIDE 0.9 % IV SOLN
Freq: Once | INTRAVENOUS | Status: AC
  Administered 2012-11-14: 12:00:00 via INTRAVENOUS

## 2012-11-14 NOTE — Progress Notes (Signed)
This office note has been dictated.

## 2012-11-14 NOTE — Patient Instructions (Addendum)
Ferumoxytol injection What is this medicine? FERUMOXYTOL is an iron complex. Iron is used to make healthy red blood cells, which carry oxygen and nutrients throughout the body. This medicine is used to treat iron deficiency anemia in people with chronic kidney disease. This medicine may be used for other purposes; ask your health care provider or pharmacist if you have questions. What should I tell my health care provider before I take this medicine? They need to know if you have any of these conditions: -anemia not caused by low iron levels -high levels of iron in the blood -magnetic resonance imaging (MRI) test scheduled -an unusual or allergic reaction to iron, other medicines, foods, dyes, or preservatives -pregnant or trying to get pregnant -breast-feeding How should I use this medicine? This medicine is for infusion into a vein. It is given by a health care professional in a hospital or clinic setting. Talk to your pediatrician regarding the use of this medicine in children. Special care may be needed. Overdosage: If you think you've taken too much of this medicine contact a poison control center or emergency room at once. Overdosage: If you think you have taken too much of this medicine contact a poison control center or emergency room at once. NOTE: This medicine is only for you. Do not share this medicine with others. What if I miss a dose? It is important not to miss your dose. Call your doctor or health care professional if you are unable to keep an appointment. What may interact with this medicine? This medicine may interact with the following medications: -other iron products This list may not describe all possible interactions. Give your health care provider a list of all the medicines, herbs, non-prescription drugs, or dietary supplements you use. Also tell them if you smoke, drink alcohol, or use illegal drugs. Some items may interact with your medicine. What should I watch  for while using this medicine? Visit your doctor or healthcare professional regularly. Tell your doctor or healthcare professional if your symptoms do not start to get better or if they get worse. You may need blood work done while you are taking this medicine. You may need to follow a special diet. Talk to your doctor. Foods that contain iron include: whole grains/cereals, dried fruits, beans, or peas, leafy green vegetables, and organ meats (liver, kidney). What side effects may I notice from receiving this medicine? Side effects that you should report to your doctor or health care professional as soon as possible: -allergic reactions like skin rash, itching or hives, swelling of the face, lips, or tongue -breathing problems -changes in blood pressure -feeling faint or lightheaded, falls -fever or chills -flushing, sweating, or hot feelings -swelling of the ankles or feet Side effects that usually do not require medical attention (Report these to your doctor or health care professional if they continue or are bothersome.): -diarrhea -headache -nausea, vomiting -stomach pain This list may not describe all possible side effects. Call your doctor for medical advice about side effects. You may report side effects to FDA at 1-800-FDA-1088. Where should I keep my medicine? This drug is given in a hospital or clinic and will not be stored at home. NOTE: This sheet is a summary. It may not cover all possible information. If you have questions about this medicine, talk to your doctor, pharmacist, or health care provider.  2012, [employer]vier/Gold Standard. (03/21/2008 9:48:25 PM) 

## 2012-11-15 NOTE — Progress Notes (Signed)
CC:   Lorelle Formosa, M.D.  DIAGNOSIS:  Microcytic anemia-alpha thalassemia, combined with iron deficiency.  CURRENT THERAPY:  The patient to receive IV iron today.  INTERIM HISTORY:  Meghan Solis comes in for her followup.  This is her first office visit.  I saw her in constipation at [facility] on April 26th.  At that point in time, she presented with marked anemia. She does have heavy cycles.  Her hemoglobin I think was 6.4.  She had an MCV of 58.  We did go ahead and get iron studies on her.  Her ferritin was 2 with an iron saturation of 3.  She was discharged.  She felt well.  She now comes to the office for IV iron.  Of note, her reticulocyte count when corrected was 0.1.  She clearly is iron deficient.  She may have a __________ of alpha thalassemia, but this is not a clinical issue from my point of view.  She feels okay right now.  She is still working.  She has had no syncope.  She does feel tired.  Her appetite is okay.  She has had no nausea.  There has been no weight loss or weight gain.  She has had no rashes.  PHYSICAL EXAMINATION:  General:  This is a well-developed, well- nourished black female in no obvious distress.  Vital signs: Temperature of 98.2, pulse 73, respiratory rate 16, blood pressure 105/60.  Weight is 187.  Head and neck:  Normocephalic, atraumatic skull.  Her conjunctivae are pink.  There is no scleral icterus.  There are no oral lesions.  There is no adenopathy in the neck.  Lungs:  Clear bilaterally.  There are no rales, wheezes, or rhonchi.  Cardiac: Regular rate and rhythm with a normal S1 and S2.  There are no murmurs, rubs, or bruits.  Abdomen:  Soft with good bowel sounds.  There is no palpable abdominal mass.  There is no fluid wave.  No palpable hepatosplenomegaly.  Extremities:  No clubbing, cyanosis, or edema. Skin:  No rash, ecchymosis, or petechia.  LABORATORY STUDIES:  White cell count is 5.4, hemoglobin 8.3,  hematocrit 25.9, platelet count 261.  MCV is 65.  Peripheral smear does show microcytic red cells.  There is some polychromasia.  She has target cells.  I see a couple of spherocytes. There are no nucleated red cells.  She has no teardrop cells.  White cells appear normal in morphology and maturation.  There are no immature myeloid or lymphoid forms.  There are no hypersegmented polys. Platelets are adequate in number and size.  IMPRESSION:  Meghan Solis is a very charming 42 year old African American female with alpha thalassemia.  Her main problem now is iron deficiency.  I really believe that with IV iron, we will be able to get her anemia resolved.  She apparently is taking oral iron.  I told her to stop taking the oral iron.  I just do not see that this is really going to help her out any.  Of note, she does have fibromyalgia.  Hopefully, getting iron back into her will help ease up the fibromyalgia pain.  I want to see her back in about 5 weeks so.  By then, we should be able to see her blood count getting up to 10 or above.  We should see the reticulocyte count improving.  I suspect that she may need another dose of iron in the future.    ______________________________ Josph Macho,  M.D. PRE/MEDQ  D:  11/14/2012  T:  11/15/2012  Job:  [id number]

## 2012-11-17 ENCOUNTER — Ambulatory Visit: Payer: Medicaid Other | Admitting: Rehabilitation

## 2012-11-28 ENCOUNTER — Encounter: Payer: Self-pay | Admitting: Obstetrics

## 2012-11-28 ENCOUNTER — Ambulatory Visit (INDEPENDENT_AMBULATORY_CARE_PROVIDER_SITE_OTHER): Payer: Medicaid Other | Admitting: Obstetrics

## 2012-11-28 ENCOUNTER — Ambulatory Visit: Payer: Medicaid Other

## 2012-11-28 ENCOUNTER — Other Ambulatory Visit (INDEPENDENT_AMBULATORY_CARE_PROVIDER_SITE_OTHER): Payer: Medicaid Other

## 2012-11-28 VITALS — BP 105/67 | HR 73 | Temp 98.3°F | Ht 67.0 in | Wt 185.0 lb

## 2012-11-28 DIAGNOSIS — N390 Urinary tract infection, site not specified: Secondary | ICD-10-CM

## 2012-11-28 DIAGNOSIS — Z113 Encounter for screening for infections with a predominantly sexual mode of transmission: Secondary | ICD-10-CM

## 2012-11-28 LAB — POCT URINALYSIS DIPSTICK
Nitrite, UA: NEGATIVE
Urobilinogen, UA: NEGATIVE

## 2012-11-28 NOTE — Progress Notes (Signed)
.   Subjective:     Meghan Solis is a 42 y.o. female here for a problem visit.  Current complaints: Patient states that her urine is cloudy with an odor.  She also has a discharge - denies any odor or itching.  Personal health questionnaire reviewed: yes.   Gynecologic History Patient's last menstrual period was 11/21/2012. Contraception: none Last Pap: 01/2012. Results were:  Normal with positive HPV Last mammogram: 02/2012  Obstetric History OB History   Grav Para Term Preterm Abortions TAB SAB Ect Mult Living   3 2 2  1  1   2      # Outc Date GA Lbr Len/2nd Wgt Sex Del Anes PTL Lv   1 SAB            2 TRM            3 TRM                The following portions of the patient's history were reviewed and updated as appropriate: allergies, current medications, past family history, past medical history, past social history, past surgical history and problem list.  Review of Systems Pertinent items are noted in HPI.    Objective:    General appearance: alert and no distress Abdomen: normal findings: soft, non-tender Pelvic: external genitalia normal and vagina normal without discharge    Assessment:    Healthy female exam.    Plan:   F/U for annual or prn.  H/O Thalassanemia, being followed by Hematology

## 2012-11-28 NOTE — Progress Notes (Unsigned)
Entered in error

## 2012-11-29 LAB — URINE CULTURE

## 2012-11-30 DIAGNOSIS — N390 Urinary tract infection, site not specified: Secondary | ICD-10-CM | POA: Insufficient documentation

## 2012-12-20 ENCOUNTER — Other Ambulatory Visit (HOSPITAL_BASED_OUTPATIENT_CLINIC_OR_DEPARTMENT_OTHER): Payer: Medicaid Other | Admitting: Lab

## 2012-12-20 ENCOUNTER — Ambulatory Visit (HOSPITAL_BASED_OUTPATIENT_CLINIC_OR_DEPARTMENT_OTHER): Payer: Medicaid Other

## 2012-12-20 ENCOUNTER — Ambulatory Visit (HOSPITAL_BASED_OUTPATIENT_CLINIC_OR_DEPARTMENT_OTHER): Payer: Medicaid Other | Admitting: Medical

## 2012-12-20 VITALS — BP 105/67 | HR 72 | Temp 98.0°F | Resp 16 | Ht 67.0 in | Wt 191.0 lb

## 2012-12-20 DIAGNOSIS — D649 Anemia, unspecified: Secondary | ICD-10-CM

## 2012-12-20 DIAGNOSIS — D569 Thalassemia, unspecified: Secondary | ICD-10-CM

## 2012-12-20 DIAGNOSIS — D509 Iron deficiency anemia, unspecified: Secondary | ICD-10-CM

## 2012-12-20 LAB — CBC WITH DIFFERENTIAL (CANCER CENTER ONLY)
BASO#: 0 10*3/uL (ref 0.0–0.2)
Eosinophils Absolute: 0.2 10*3/uL (ref 0.0–0.5)
HCT: 32.6 % — ABNORMAL LOW (ref 34.8–46.6)
HGB: 9.6 g/dL — ABNORMAL LOW (ref 11.6–15.9)
LYMPH%: 37 % (ref 14.0–48.0)
MCH: 21 pg — ABNORMAL LOW (ref 26.0–34.0)
MCV: 71 fL — ABNORMAL LOW (ref 81–101)
MONO#: 0.2 10*3/uL (ref 0.1–0.9)
MONO%: 5.1 % (ref 0.0–13.0)
NEUT#: 2.5 10*3/uL (ref 1.5–6.5)
RBC: 4.58 10*6/uL (ref 3.70–5.32)
WBC: 4.7 10*3/uL (ref 3.9–10.0)

## 2012-12-20 LAB — RETICULOCYTES (CHCC)
RBC.: 4.72 MIL/uL (ref 3.87–5.11)
Retic Ct Pct: 0.4 % (ref 0.4–2.3)

## 2012-12-20 LAB — IRON AND TIBC
%SAT: 39 % (ref 20–55)
UIBC: 125 ug/dL (ref 125–400)

## 2012-12-20 LAB — FERRITIN: Ferritin: 81 ng/mL (ref 10–291)

## 2012-12-20 LAB — CHCC SATELLITE - SMEAR

## 2012-12-20 MED ORDER — SODIUM CHLORIDE 0.9 % IV SOLN
Freq: Once | INTRAVENOUS | Status: AC
  Administered 2012-12-20: 12:00:00 via INTRAVENOUS

## 2012-12-20 MED ORDER — SODIUM CHLORIDE 0.9 % IJ SOLN
3.0000 mL | Freq: Once | INTRAMUSCULAR | Status: DC | PRN
  Filled 2012-12-20: qty 10

## 2012-12-20 MED ORDER — SODIUM CHLORIDE 0.9 % IV SOLN
1020.0000 mg | Freq: Once | INTRAVENOUS | Status: AC
  Administered 2012-12-20: 1020 mg via INTRAVENOUS
  Filled 2012-12-20: qty 34

## 2012-12-20 NOTE — Progress Notes (Signed)
DIAGNOSIS:  Microcytic anemia-alpha thalassemia, combined with iron deficiency.  CURRENT THERAPY:  IV iron as indicated. (Last dose of IV iron was 11/14/12).  INTERIM HISTORY:  Ms. Meghan Solis presents today for an office followup visit.  Overall she reports she's doing quite well.  She recently had IV iron back on 11/14/2012.  At that time her ferritin was 1, iron 20 with 5% saturation.  Her hemoglobin was 8.3.  She reports that she could tell a significant difference with her energy level.  She tolerated IV iron well without any complications.  Her hemoglobin is 9.6 today.  Her MCV is still on the lower side at 71. We will give her another dose of IV iron today.  She reports that she still continues to have very heavy menstrual cycles.  She did discuss this with her gynecologist who recommended an ablation.  She is strongly considering this.  She does have alpha thalassemia, however this does not clinical issue at this point.  She is able to perform her activities of daily living without any hindrance or decline.  She reports a good appetite she states she's not craving ice.  She denies any nausea, vomiting, diarrhea or constipation.  She denies any fevers, chills or night sweats.  She denies any chest pain, shortness of breath or cough.  She denies any palpable adenopathy.  She denies any abdominal pain.  She denies any obvious or abnormal bleeding other than her menstrual cycles.  She denies any lower leg swelling.  She denies any mouth sores or sore tongue.  She denies any headaches, visual changes or rashes.  Review of Systems: Constitutional:Negative for malaise/fatigue, fever, chills, weight loss, diaphoresis, activity change, appetite change, and unexpected weight change.  HEENT: Negative for double vision, blurred vision, visual loss, ear pain, tinnitus, congestion, rhinorrhea, epistaxis sore throat or sinus disease, oral pain/lesion, tongue soreness Respiratory: Negative for cough, chest tightness,  shortness of breath, wheezing and stridor.  Cardiovascular: Negative for chest pain, palpitations, leg swelling, orthopnea, PND, DOE or claudication Gastrointestinal: Negative for nausea, vomiting, abdominal pain, diarrhea, constipation, blood in stool, melena, hematochezia, abdominal distention, anal bleeding, rectal pain, anorexia and hematemesis.  Genitourinary: Negative for dysuria, frequency, hematuria,  Musculoskeletal: Negative for myalgias, back pain, joint swelling, arthralgias and gait problem.  Skin: Negative for rash, color change, pallor and wound.  Neurological:. Negative for dizziness/light-headedness, tremors, seizures, syncope, facial asymmetry, speech difficulty, weakness, numbness, headaches and paresthesias.  Hematological: Negative for adenopathy. Does not bruise/bleed easily.  Psychiatric/Behavioral:  Negative for depression, no loss of interest in normal activity or change in sleep pattern.   Physical Exam: This is a pleasant 42 year old well developed well nourished African American female in no obvious distress Vitals: Temperature 98.0 degrees pulse 72 respirations 16 blood pressure 105/67 weight 191 pounds HEENT reveals a normocephalic, atraumatic skull, no scleral icterus, no oral lesions  Neck is supple without any cervical or supraclavicular adenopathy.  Lungs are clear to auscultation bilaterally. There are no wheezes, rales or rhonci Cardiac is regular rate and rhythm with a normal S1 and S2. There are no murmurs, rubs, or bruits.  Abdomen is soft with good bowel sounds, there is no palpable mass. There is no palpable hepatosplenomegaly. There is no palpable fluid wave.  Musculoskeletal no tenderness of the spine, ribs, or hips.  Extremities there are no clubbing, cyanosis, or edema.  Skin no petechia, purpura or ecchymosis Neurologic is nonfocal.  Laboratory Data: White count 4.7 hemoglobin 9.6 hematocrit 32.6 MCV 71 platelets 243,000  Current Outpatient  Prescriptions on File Prior to Visit  Medication Sig Dispense Refill  . gabapentin (NEURONTIN) 800 MG tablet Take 800 mg by mouth 3 (three) times daily.      Marland Kitchen ibuprofen (ADVIL,MOTRIN) 600 MG tablet Take 600 mg by mouth every 8 (eight) hours as needed for pain.      . Multiple Vitamin (MULTIVITAMIN WITH MINERALS) TABS Take 1 tablet by mouth daily.      Marland Kitchen oxyCODONE (OXYCONTIN) 10 MG 12 hr tablet Take 10 mg by mouth every 12 (twelve) hours as needed for pain. For pain      . traMADol (ULTRAM) 50 MG tablet Take 50 mg by mouth 3 (three) times daily. scheduled      . [DISCONTINUED] famotidine (PEPCID AC) 10 MG chewable tablet Chew 20 mg by mouth 2 (two) times daily as needed. For stomach pain       No current facility-administered medications on file prior to visit.   Assessment/Plan: This is a pleasant 42 year old [place] American female with the following issues:  #1.  Iron deficiency anemia.  This is secondary to metromenorrhagia.  The last time she received IV iron was back on 11/14/2012.  Her MCV is still low.  We will go ahead and give her another dose today.  #2. Alpa thalassemia.  This is really not an issue for her at the time being.  #3.  Followup.  We will follow back up with Meghan Solis in about 6 weeks but before then should there be questions or concerns.

## 2012-12-20 NOTE — Patient Instructions (Addendum)
nFerumoxytol injection What is this medicine? FERUMOXYTOL is an iron complex. Iron is used to make healthy red blood cells, which carry oxygen and nutrients throughout the body. This medicine is used to treat iron deficiency anemia in people with chronic kidney disease. This medicine may be used for other purposes; ask your health care provider or pharmacist if you have questions. What should I tell my health care provider before I take this medicine? They need to know if you have any of these conditions: -anemia not caused by low iron levels -high levels of iron in the blood -magnetic resonance imaging (MRI) test scheduled -an unusual or allergic reaction to iron, other medicines, foods, dyes, or preservatives -pregnant or trying to get pregnant -breast-feeding How should I use this medicine? This medicine is for infusion into a vein. It is given by a health care professional in a hospital or clinic setting. Talk to your pediatrician regarding the use of this medicine in children. Special care may be needed. Overdosage: If you think you've taken too much of this medicine contact a poison control center or emergency room at once. Overdosage: If you think you have taken too much of this medicine contact a poison control center or emergency room at once. NOTE: This medicine is only for you. Do not share this medicine with others. What if I miss a dose? It is important not to miss your dose. Call your doctor or health care professional if you are unable to keep an appointment. What may interact with this medicine? This medicine may interact with the following medications: -other iron products This list may not describe all possible interactions. Give your health care provider a list of all the medicines, herbs, non-prescription drugs, or dietary supplements you use. Also tell them if you smoke, drink alcohol, or use illegal drugs. Some items may interact with your medicine. What should I watch  for while using this medicine? Visit your doctor or healthcare professional regularly. Tell your doctor or healthcare professional if your symptoms do not start to get better or if they get worse. You may need blood work done while you are taking this medicine. You may need to follow a special diet. Talk to your doctor. Foods that contain iron include: whole grains/cereals, dried fruits, beans, or peas, leafy green vegetables, and organ meats (liver, kidney). What side effects may I notice from receiving this medicine? Side effects that you should report to your doctor or health care professional as soon as possible: -allergic reactions like skin rash, itching or hives, swelling of the face, lips, or tongue -breathing problems -changes in blood pressure -feeling faint or lightheaded, falls -fever or chills -flushing, sweating, or hot feelings -swelling of the ankles or feet Side effects that usually do not require medical attention (Report these to your doctor or health care professional if they continue or are bothersome.): -diarrhea -headache -nausea, vomiting -stomach pain This list may not describe all possible side effects. Call your doctor for medical advice about side effects. You may report side effects to FDA at 1-800-FDA-1088. Where should I keep my medicine? This drug is given in a hospital or clinic and will not be stored at home. NOTE: This sheet is a summary. It may not cover all possible information. If you have questions about this medicine, talk to your doctor, pharmacist, or health care provider.  2013, [employer]. (03/21/2008 9:48:25 PM)

## 2012-12-21 ENCOUNTER — Other Ambulatory Visit: Payer: Medicaid Other | Admitting: Lab

## 2012-12-21 ENCOUNTER — Ambulatory Visit: Payer: Medicaid Other

## 2012-12-21 ENCOUNTER — Ambulatory Visit: Payer: Medicaid Other | Admitting: Hematology & Oncology

## 2012-12-26 ENCOUNTER — Telehealth: Payer: Self-pay | Admitting: Hematology & Oncology

## 2012-12-26 NOTE — Telephone Encounter (Addendum)
Message copied by Cathi Roan on Mon Dec 26, 2012  2:46 PM ------      Message from: Arlan Organ R      Created: Mon Dec 26, 2012  7:29 AM       Call - iron is better!!  Cindee Lame  12-26-12  Called patient and left message regarding above MD message. Also gave patient most recent results. S.Trynosky LPN ------

## 2013-01-17 ENCOUNTER — Other Ambulatory Visit (HOSPITAL_COMMUNITY): Payer: Self-pay | Admitting: Cardiology

## 2013-01-17 DIAGNOSIS — I82401 Acute embolism and thrombosis of unspecified deep veins of right lower extremity: Secondary | ICD-10-CM

## 2013-01-19 ENCOUNTER — Emergency Department (HOSPITAL_COMMUNITY)
Admission: EM | Admit: 2013-01-19 | Discharge: 2013-01-19 | Disposition: A | Discharge status: Home / Self Care | Payer: Medicaid Other | Attending: Emergency Medicine | Admitting: Emergency Medicine

## 2013-01-19 ENCOUNTER — Encounter (HOSPITAL_COMMUNITY): Payer: Self-pay

## 2013-01-19 DIAGNOSIS — Z79899 Other long term (current) drug therapy: Secondary | ICD-10-CM | POA: Insufficient documentation

## 2013-01-19 DIAGNOSIS — Z87828 Personal history of other (healed) physical injury and trauma: Secondary | ICD-10-CM | POA: Insufficient documentation

## 2013-01-19 DIAGNOSIS — R209 Unspecified disturbances of skin sensation: Secondary | ICD-10-CM | POA: Insufficient documentation

## 2013-01-19 DIAGNOSIS — IMO0001 Reserved for inherently not codable concepts without codable children: Secondary | ICD-10-CM | POA: Insufficient documentation

## 2013-01-19 DIAGNOSIS — M5412 Radiculopathy, cervical region: Secondary | ICD-10-CM | POA: Insufficient documentation

## 2013-01-19 DIAGNOSIS — Z86718 Personal history of other venous thrombosis and embolism: Secondary | ICD-10-CM | POA: Insufficient documentation

## 2013-01-19 DIAGNOSIS — Z862 Personal history of diseases of the blood and blood-forming organs and certain disorders involving the immune mechanism: Secondary | ICD-10-CM | POA: Insufficient documentation

## 2013-01-19 DIAGNOSIS — G5601 Carpal tunnel syndrome, right upper limb: Secondary | ICD-10-CM

## 2013-01-19 DIAGNOSIS — G5600 Carpal tunnel syndrome, unspecified upper limb: Secondary | ICD-10-CM | POA: Insufficient documentation

## 2013-01-19 MED ORDER — METHOCARBAMOL 500 MG PO TABS: 500.0000 mg | ORAL_TABLET | Freq: Two times a day (BID) | ORAL | Status: DC

## 2013-01-19 MED ORDER — IBUPROFEN 400 MG PO TABS: 400.0000 mg | ORAL_TABLET | Freq: Four times a day (QID) | ORAL | Status: DC | PRN

## 2013-01-19 NOTE — ED Notes (Signed)
Pt c/o right sided neck pain that radiates into right arm and down into hand that started in April. Pt sts she thinks it is a pinched nerve and has been trying to get a referral to a neurologist by her PCP but hasn't been able to get in. Pt sts she called a neuro office today but her insurance will not cover without a referral. Pt takes Neurotin for her fibromyalgia but sts the pain has gotten so bad that she needs something stronger and took a hydrocodone the other day but that didn't do much for the pain either. Pt in nad, skin warm and dry, resp e/u.

## 2013-01-19 NOTE — ED Provider Notes (Signed)
History    This chart was scribed for Meghan Solis, non-physician practitioner working with Meghan Solis. Meghan Payor, MD by Meghan Solis, ED Scribe. This patient was seen in room TR04C/TR04C and the patient's care was started at 2051.  CSN: [id number] Arrival date & time 01/19/13  2051  First MD Initiated Contact with Patient 01/19/13 2224     Chief Complaint  Patient presents with  . Neck Pain    The history is provided by the patient. No language interpreter was used.    HPI Comments: Meghan Solis is a 42 y.o. female who presents to the Emergency Department complaining of constant, unchanged R neck pain that radiates to R axillary area and through R arm starting in April 2014. Pain is described as throbbing. She also has associated numbness to her hand but mainly to the first 3 digits. Nothing aggravates the pain. Laying on the R side alleviates the pain. 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   Past Medical History  Diagnosis Date  . Fibromyalgia   . Thalassanemia   . Medial meniscus tear, right knee 05/20/2012  . Lateral meniscus tear 05/20/2012  . DVT (deep venous thrombosis)    Past Surgical History  Procedure Laterality Date  . Knee surgery      L knee  . Laparoscopic gastric banding    . Dilation and curettage of uterus    . Knee arthroscopy with lateral menisectomy  05/20/2012    Procedure: KNEE ARTHROSCOPY WITH LATERAL MENISECTOMY;  Surgeon: Eulas Post, MD;  Location: [facility];  Service: Orthopedics;  Laterality: Right;  RIGHT KNEE ARTHROSCOPY LATERAL AND MEDIAL MENISCECTOMY   . Fatty tumor removed    . Hernia repair     Family History  Problem Relation Age of Onset  . Diabetes Mother   . Cancer Father    History  Substance Use Topics  . Smoking status: Never Smoker   . Smokeless tobacco: Not on file  . Alcohol Use: Yes     Comment: occasionally   OB History   Grav Para Term Preterm Abortions TAB SAB Ect Mult Living   3 2 2  1  1    2      Review of Systems  HENT: Positive for neck pain.   Neurological: Positive for numbness.  All other systems reviewed and are negative.    Allergies  Sulfa antibiotics  Home Medications   Current Outpatient Rx  Name  Route  Sig  Dispense  Refill  . gabapentin (NEURONTIN) 800 MG tablet   Oral   Take 800 mg by mouth 3 (three) times daily.         Marland Kitchen ibuprofen (ADVIL,MOTRIN) 600 MG tablet   Oral   Take 600 mg by mouth every 8 (eight) hours as needed for pain. For pain         . Multiple Vitamin (MULTIVITAMIN WITH MINERALS) TABS   Oral   Take 1 tablet by mouth daily.         Marland Kitchen oxyCODONE (OXYCONTIN) 10 MG 12 hr tablet   Oral   Take 10 mg by mouth every 12 (twelve) hours as needed for pain. For pain         . traMADol (ULTRAM) 50 MG tablet   Oral   Take 50 mg by mouth 3 (three) times daily. scheduled          BP 130/92  Pulse 68  Temp(Src) 98.3 F (36.8 C) (  Oral)  Resp 16  SpO2 100%  LMP 01/11/2013 Physical Exam  Nursing note and vitals reviewed. Constitutional: She is oriented to person, place, and time. She appears well-developed and well-nourished.  HENT:  Head: Normocephalic and atraumatic.  Eyes: Conjunctivae and EOM are normal. Pupils are equal, round, and reactive to light.  Neck: Normal range of motion. Neck supple.  Cardiovascular: Normal rate, regular rhythm and normal heart sounds.   Pulmonary/Chest: Effort normal and breath sounds normal.  Abdominal: Soft. Bowel sounds are normal.  Musculoskeletal: Normal range of motion. She exhibits tenderness. She exhibits no edema.  Normal ROM. Normal strength. R paraspinal cervical tenderness. Tenderness along trapezius muscle. Normal R elbow ROM. Normal R wrist ROM. Subjective paresthesia to 1st, 2nd, and 3rd  fingers.   Neurological: She is alert and oriented to person, place, and time.  Skin: Skin is warm and dry.  Psychiatric: She has a normal mood and affect.    ED Course  Procedures  (including critical care time)  DIAGNOSTIC STUDIES: Oxygen Saturation is 100% on RA, normal by my interpretation.    COORDINATION OF CARE: 10:46 PM Discussed treatment plan with pt at bedside and pt agreed to plan.   Labs Reviewed - No data to display No results found. 1. Cervical radicular pain   2. Carpal tunnel syndrome, right     MDM  BP 130/92  Pulse 68  Temp(Src) 98.3 F (36.8 C) (Oral)  Resp 16  SpO2 100%  LMP 01/11/2013   I personally performed the services described in this documentation, which was scribed in my presence. The recorded information has been reviewed and is accurate.    Meghan Helper, PA-C 01/19/13 2317

## 2013-01-19 NOTE — ED Notes (Signed)
Pt c/o Right neck pain radiating into her Right axillary area, numbness to her Right wrist and tips of fingers since April. Pt reports her symptoms started after a blood transfusion in April. 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

## 2013-01-21 NOTE — ED Provider Notes (Signed)
Medical screening examination/treatment/procedure(s) were performed by non-physician practitioner and as supervising physician I was immediately available for consultation/collaboration.  [name]. Rubin Payor, MD 01/21/13 1401

## 2013-01-26 ENCOUNTER — Ambulatory Visit (INDEPENDENT_AMBULATORY_CARE_PROVIDER_SITE_OTHER): Payer: Medicaid Other | Admitting: Obstetrics

## 2013-01-26 ENCOUNTER — Telehealth (HOSPITAL_COMMUNITY): Payer: Self-pay | Admitting: Cardiology

## 2013-01-26 ENCOUNTER — Encounter: Payer: Self-pay | Admitting: Obstetrics

## 2013-01-26 VITALS — BP 102/71 | HR 96 | Temp 98.4°F | Ht 67.0 in | Wt 183.0 lb

## 2013-01-26 DIAGNOSIS — D649 Anemia, unspecified: Secondary | ICD-10-CM

## 2013-01-26 DIAGNOSIS — Z113 Encounter for screening for infections with a predominantly sexual mode of transmission: Secondary | ICD-10-CM

## 2013-01-26 DIAGNOSIS — Z139 Encounter for screening, unspecified: Secondary | ICD-10-CM

## 2013-01-26 DIAGNOSIS — Z0000 Encounter for general adult medical examination without abnormal findings: Secondary | ICD-10-CM

## 2013-01-26 MED ORDER — FOLIC ACID 1 MG PO TABS: 1.0000 mg | ORAL_TABLET | Freq: Every day | ORAL | Status: DC

## 2013-01-26 NOTE — Telephone Encounter (Signed)
Asked Meghan Solis to confirm with Dr. Herbie Baltimore that LEV doppler was needed since patient had this test April, 2014. Per Dr. Herbie Baltimore the patient does not need another LEV and does not require follow up in office at this time. I called the patient today to notify. She understood that I was cancelling her appointment and that she is to call the office should she have any changes.

## 2013-01-26 NOTE — Progress Notes (Signed)
.   Subjective:     Meghan Solis is a 43 y.o. female here for a routine exam.  No current complaints.  Personal health questionnaire reviewed: yes.   Gynecologic History Patient's last menstrual period was 01/11/2013. Contraception: none Last Pap: 01/2012. Results were:normal with + HPV Last mammogram: 2012. Results were: normal  Obstetric History OB History   Grav Para Term Preterm Abortions TAB SAB Ect Mult Living   3 2 2  1  1   2      # Outc Date GA Lbr Len/2nd Wgt Sex Del Anes PTL Lv   1 SAB            2 TRM            3 TRM                The following portions of the patient's history were reviewed and updated as appropriate: allergies, current medications, past family history, past medical history, past social history, past surgical history and problem list.  Review of Systems Pertinent items are noted in HPI.    Objective:    General appearance: alert and no distress Breasts: normal appearance, no masses or tenderness Abdomen: normal findings: soft, non-tender Pelvic: cervix normal in appearance, external genitalia normal, no adnexal masses or tenderness, no cervical motion tenderness, uterus normal size, shape, and consistency and vagina normal without discharge    Assessment:    Healthy female exam.    Plan:    Education reviewed: self breast exams. Mammogram ordered. Follow up in: 1 year.

## 2013-01-27 ENCOUNTER — Encounter: Payer: Self-pay | Admitting: Obstetrics

## 2013-01-27 LAB — PAP IG W/ RFLX HPV ASCU

## 2013-01-27 LAB — GC/CHLAMYDIA PROBE AMP: CT Probe RNA: NEGATIVE

## 2013-01-27 LAB — WET PREP BY MOLECULAR PROBE
Candida species: NEGATIVE
Gardnerella vaginalis: NEGATIVE
Trichomonas vaginosis: NEGATIVE

## 2013-01-30 ENCOUNTER — Encounter (HOSPITAL_COMMUNITY): Payer: Medicaid Other

## 2013-01-31 ENCOUNTER — Other Ambulatory Visit (HOSPITAL_BASED_OUTPATIENT_CLINIC_OR_DEPARTMENT_OTHER): Payer: Medicaid Other | Admitting: Lab

## 2013-01-31 ENCOUNTER — Ambulatory Visit (HOSPITAL_BASED_OUTPATIENT_CLINIC_OR_DEPARTMENT_OTHER): Payer: Medicaid Other | Admitting: Hematology & Oncology

## 2013-01-31 VITALS — BP 109/69 | HR 77 | Temp 98.3°F | Resp 16 | Ht 67.0 in | Wt 197.0 lb

## 2013-01-31 DIAGNOSIS — D509 Iron deficiency anemia, unspecified: Secondary | ICD-10-CM

## 2013-01-31 DIAGNOSIS — D649 Anemia, unspecified: Secondary | ICD-10-CM

## 2013-01-31 LAB — IRON AND TIBC CHCC
%SAT: 47 % (ref 21–57)
Iron: 88 ug/dL (ref 41–142)
TIBC: 188 ug/dL — ABNORMAL LOW (ref 236–444)

## 2013-01-31 LAB — CBC WITH DIFFERENTIAL (CANCER CENTER ONLY)
BASO%: 0.4 % (ref 0.0–2.0)
EOS%: 5.2 % (ref 0.0–7.0)
LYMPH#: 1.7 10*3/uL (ref 0.9–3.3)
MCH: 23 pg — ABNORMAL LOW (ref 26.0–34.0)
MCHC: 30.9 g/dL — ABNORMAL LOW (ref 32.0–36.0)
MONO%: 7 % (ref 0.0–13.0)
NEUT#: 2.6 10*3/uL (ref 1.5–6.5)
NEUT%: 52.9 % (ref 39.6–80.0)
RDW: 17.1 % — ABNORMAL HIGH (ref 11.1–15.7)

## 2013-01-31 LAB — RETICULOCYTES (CHCC): RBC.: 4.95 MIL/uL (ref 3.87–5.11)

## 2013-01-31 NOTE — Progress Notes (Signed)
CC:   Meghan Solis, M.D.  DIAGNOSES: 1. Iron-deficiency anemia. 2. Alpha thalassemia.  CURRENT THERAPY:  IV iron as indicated.  INTERIM HISTORY:  Meghan Solis comes in for a followup.  She is feeling better.  We gave her iron back in May.  At that point in time, her hemoglobin was down to 8.4.  Her ferritin was 1 with an iron saturation of 5%.  This was rechecked in June, and ferritin was up to 81 with iron saturation of 39%.  She is still having heavy cycles.  I am sure, at some point, she will need more IV iron.  She is traveling quite a bit this summer.  She is about to go down in [place] to get her daughter and then go up to [place].  She has had no bleeding outside of that with the heavy monthly cycles.  PHYSICAL EXAMINATION:  General:  This is a well-developed, well- nourished black female in no obvious distress.  Vital signs: Temperature of 98.3, pulse 77, respiratory rate 16, blood pressure is 109/69.  Weight is 197.  Head and neck:  Normocephalic, atraumatic skull.  There are no ocular or oral lesions.  There are no palpable cervical or supraclavicular lymph nodes.  Lungs:  Clear bilaterally. Cardiac:  Regular rate and rhythm with a normal S1, S2.  There are no murmurs, rubs or bruits.  Abdomen:  Soft with good bowel sounds.  There is no palpable abdominal mass.  There is no fluid wave.  No palpable hepatosplenomegaly is noted.  Extremities:  Show no clubbing, cyanosis or edema.  Skin:  No rashes, ecchymoses or petechia.  LABORATORY STUDIES:  White cell count is 4.8, hemoglobin 11.2, hematocrit 36.2, platelet count 237,000.  MCV is 74.  IMPRESSION:  Meghan Solis is a very charming, 42 year old, African American female with iron deficiency as her main problem.  She has alpha- thalassemia, which is contributing to her low MCV, but, in my mind, was not that much of an issue.  She responded well to the iron.  Again, I suspect that she will likely need iron  again.  We will plan to get her back in about 2 months' time.  By then, she may need to be given IV iron.    ______________________________ Josph Macho, M.D. PRE/MEDQ  D:  01/31/2013  T:  01/31/2013  Job:  [id number]

## 2013-01-31 NOTE — Progress Notes (Signed)
This office note has been dictated.

## 2013-02-01 ENCOUNTER — Encounter: Payer: Self-pay | Admitting: Obstetrics

## 2013-02-01 ENCOUNTER — Telehealth: Payer: Self-pay | Admitting: *Deleted

## 2013-02-01 NOTE — Telephone Encounter (Signed)
Called patient to let her know that her iron levels were good per dr. ennever 

## 2013-02-01 NOTE — Telephone Encounter (Signed)
Message copied by Anselm Jungling on Wed Feb 01, 2013 11:11 AM ------      Message from: Josph Macho      Created: Tue Jan 31, 2013  8:36 PM       Call - iron is ok!! Cindee Lame ------

## 2013-02-23 ENCOUNTER — Ambulatory Visit (INDEPENDENT_AMBULATORY_CARE_PROVIDER_SITE_OTHER): Payer: Medicaid Other | Admitting: Obstetrics

## 2013-02-23 ENCOUNTER — Other Ambulatory Visit (HOSPITAL_COMMUNITY): Payer: Self-pay | Admitting: Orthopedic Surgery

## 2013-02-23 ENCOUNTER — Ambulatory Visit (HOSPITAL_COMMUNITY)
Admission: RE | Admit: 2013-02-23 | Discharge: 2013-02-23 | Disposition: A | Discharge status: Home / Self Care | Payer: Medicaid Other | Source: Ambulatory Visit | Attending: Cardiovascular Disease | Admitting: Cardiovascular Disease

## 2013-02-23 VITALS — BP 121/82 | HR 69 | Temp 98.2°F | Ht 67.0 in | Wt 195.0 lb

## 2013-02-23 DIAGNOSIS — R3989 Other symptoms and signs involving the genitourinary system: Secondary | ICD-10-CM

## 2013-02-23 DIAGNOSIS — R399 Unspecified symptoms and signs involving the genitourinary system: Secondary | ICD-10-CM

## 2013-02-23 DIAGNOSIS — I82401 Acute embolism and thrombosis of unspecified deep veins of right lower extremity: Secondary | ICD-10-CM

## 2013-02-23 DIAGNOSIS — I82409 Acute embolism and thrombosis of unspecified deep veins of unspecified lower extremity: Secondary | ICD-10-CM

## 2013-02-23 LAB — POCT URINALYSIS DIPSTICK
Bilirubin, UA: NEGATIVE
Nitrite, UA: NEGATIVE
Urobilinogen, UA: NEGATIVE
pH, UA: 8

## 2013-02-23 MED ORDER — CEFUROXIME AXETIL 250 MG PO TABS: 500.0000 mg | ORAL_TABLET | Freq: Two times a day (BID) | ORAL | Status: DC

## 2013-02-23 NOTE — Progress Notes (Signed)
Right Lower Extremity Venous Duplex Completed. Negative. °Meghan Solis ° °

## 2013-02-23 NOTE — Progress Notes (Signed)
Subjective:     Meghan Solis is a 42 y.o. female here for a routine exam.  Current complaints: Patient is in the office today with urinary complaints- odor with urine.  Personal health questionnaire reviewed: no.   Gynecologic History   Obstetric History OB History  Gravida Para Term Preterm AB SAB TAB Ectopic Multiple Living  3 2 2  1 1    2     # Outcome Date GA Lbr Len/2nd Weight Sex Delivery Anes PTL Lv  3 TRM           2 TRM           1 SAB                The following portions of the patient's history were reviewed and updated as appropriate: allergies, current medications, past family history, past medical history, past social history, past surgical history and problem list.  Review of Systems Pertinent items are noted in HPI.    Objective:    No exam performed today, patient left without being seen.    Assessment:    UTI   Plan:    Ceftin Rx.

## 2013-02-24 LAB — GC/CHLAMYDIA PROBE AMP: CT Probe RNA: NEGATIVE

## 2013-03-02 ENCOUNTER — Ambulatory Visit: Payer: Medicaid Other | Admitting: Obstetrics

## 2013-03-03 ENCOUNTER — Ambulatory Visit (HOSPITAL_COMMUNITY): Payer: Medicaid Other

## 2013-04-03 ENCOUNTER — Ambulatory Visit: Payer: Medicaid Other | Admitting: Hematology & Oncology

## 2013-04-03 ENCOUNTER — Other Ambulatory Visit: Payer: Medicaid Other | Admitting: Lab

## 2013-04-18 ENCOUNTER — Telehealth: Payer: Self-pay | Admitting: Hematology & Oncology

## 2013-04-18 NOTE — Telephone Encounter (Signed)
Pt caleed scheduled 10-30 appointment. Transferred to RN to triage

## 2013-04-19 ENCOUNTER — Other Ambulatory Visit (HOSPITAL_BASED_OUTPATIENT_CLINIC_OR_DEPARTMENT_OTHER): Payer: Medicaid Other | Admitting: Lab

## 2013-04-19 ENCOUNTER — Other Ambulatory Visit: Payer: Self-pay | Admitting: *Deleted

## 2013-04-19 DIAGNOSIS — R5383 Other fatigue: Secondary | ICD-10-CM

## 2013-04-19 DIAGNOSIS — D569 Thalassemia, unspecified: Secondary | ICD-10-CM

## 2013-04-19 DIAGNOSIS — D560 Alpha thalassemia: Secondary | ICD-10-CM

## 2013-04-19 DIAGNOSIS — D649 Anemia, unspecified: Secondary | ICD-10-CM

## 2013-04-19 DIAGNOSIS — R42 Dizziness and giddiness: Secondary | ICD-10-CM

## 2013-04-19 LAB — RETICULOCYTES (CHCC)
ABS Retic: 57.1 10*3/uL (ref 19.0–186.0)
RBC.: 4.76 MIL/uL (ref 3.87–5.11)
Retic Ct Pct: 1.2 % (ref 0.4–2.3)

## 2013-04-19 LAB — CBC WITH DIFFERENTIAL (CANCER CENTER ONLY)
BASO#: 0 10*3/uL (ref 0.0–0.2)
BASO%: 0.2 % (ref 0.0–2.0)
Eosinophils Absolute: 0.2 10*3/uL (ref 0.0–0.5)
HCT: 34.6 % — ABNORMAL LOW (ref 34.8–46.6)
HGB: 10.8 g/dL — ABNORMAL LOW (ref 11.6–15.9)
LYMPH%: 40.6 % (ref 14.0–48.0)
MCH: 23.4 pg — ABNORMAL LOW (ref 26.0–34.0)
MCV: 75 fL — ABNORMAL LOW (ref 81–101)
MONO#: 0.4 10*3/uL (ref 0.1–0.9)
MONO%: 7.1 % (ref 0.0–13.0)
NEUT%: 48.2 % (ref 39.6–80.0)
RDW: 13.2 % (ref 11.1–15.7)
WBC: 4.9 10*3/uL (ref 3.9–10.0)

## 2013-04-19 NOTE — Progress Notes (Signed)
Pt called stating she spoke to a "triage nurse" yesterday regarding getting labs drawn because she is dizzy and thinks her iron levels are low again. She does not want to go to the ER and understands she missed her last f/u appt with Dr Myna Hidalgo. Set up for labs this afternoon but explained that her iron levels would not be back until tomorrow. If her symptoms worsen, she will need to be seen in the ER.

## 2013-04-20 LAB — IRON AND TIBC CHCC
%SAT: 43 % (ref 21–57)
TIBC: 185 ug/dL — ABNORMAL LOW (ref 236–444)
UIBC: 106 ug/dL — ABNORMAL LOW (ref 120–384)

## 2013-05-05 ENCOUNTER — Other Ambulatory Visit: Payer: Self-pay | Admitting: *Deleted

## 2013-05-05 DIAGNOSIS — B9689 Other specified bacterial agents as the cause of diseases classified elsewhere: Secondary | ICD-10-CM

## 2013-05-05 MED ORDER — METRONIDAZOLE 500 MG PO TABS: 500.0000 mg | ORAL_TABLET | Freq: Two times a day (BID) | ORAL | Status: DC

## 2013-05-08 ENCOUNTER — Ambulatory Visit: Payer: Medicaid Other | Admitting: Obstetrics

## 2013-05-08 ENCOUNTER — Encounter: Payer: Self-pay | Admitting: Obstetrics

## 2013-05-11 ENCOUNTER — Other Ambulatory Visit (HOSPITAL_BASED_OUTPATIENT_CLINIC_OR_DEPARTMENT_OTHER): Payer: Medicaid Other | Admitting: Lab

## 2013-05-11 ENCOUNTER — Ambulatory Visit (HOSPITAL_BASED_OUTPATIENT_CLINIC_OR_DEPARTMENT_OTHER): Payer: Medicaid Other | Admitting: Hematology & Oncology

## 2013-05-11 VITALS — BP 106/72 | HR 73 | Temp 98.0°F | Resp 14 | Ht 67.0 in | Wt 195.0 lb

## 2013-05-11 DIAGNOSIS — D509 Iron deficiency anemia, unspecified: Secondary | ICD-10-CM

## 2013-05-11 DIAGNOSIS — D569 Thalassemia, unspecified: Secondary | ICD-10-CM

## 2013-05-11 DIAGNOSIS — R232 Flushing: Secondary | ICD-10-CM

## 2013-05-11 DIAGNOSIS — D649 Anemia, unspecified: Secondary | ICD-10-CM

## 2013-05-11 LAB — CBC WITH DIFFERENTIAL (CANCER CENTER ONLY)
BASO%: 0.5 % (ref 0.0–2.0)
Eosinophils Absolute: 0.2 10*3/uL (ref 0.0–0.5)
HGB: 11.2 g/dL — ABNORMAL LOW (ref 11.6–15.9)
MCV: 75 fL — ABNORMAL LOW (ref 81–101)
MONO#: 0.3 10*3/uL (ref 0.1–0.9)
MONO%: 7.9 % (ref 0.0–13.0)
NEUT#: 1.8 10*3/uL (ref 1.5–6.5)
Platelets: 269 10*3/uL (ref 145–400)
RBC: 4.73 10*6/uL (ref 3.70–5.32)
WBC: 4.2 10*3/uL (ref 3.9–10.0)

## 2013-05-11 NOTE — Progress Notes (Signed)
This office note has been dictated.

## 2013-05-12 LAB — IRON AND TIBC CHCC
%SAT: 42 % (ref 21–57)
Iron: 79 ug/dL (ref 41–142)
TIBC: 190 ug/dL — ABNORMAL LOW (ref 236–444)
UIBC: 111 ug/dL — ABNORMAL LOW (ref 120–384)

## 2013-05-12 LAB — FERRITIN CHCC: Ferritin: 122 ng/ml (ref 9–269)

## 2013-05-12 NOTE — Progress Notes (Signed)
CC:   Lorelle Formosa, M.D.  DIAGNOSES: 1. Iron-deficiency anemia. 2. Alpha thalassemia.  CURRENT THERAPY:  IV iron as indicated.  INTERIM HISTORY:  Ms. Freer comes in for followup.  The patient has been having some issues since we last saw her.  We last gave her iron back in June.  She does have periods of nervousness and agitation.  She just has felt weird.  There has been some blood pressure issues.  She says she has had a little bit of flushing.  She had Dopplers done of the right leg back in August.  These were negative.  She had a urinalysis done also back in August.  This showed some moderate leukocytes.  I do not think she had an actual urine infection, however.  She was checked for gonorrhea and chlamydia, I think.  These were all unremarkable.  Back on October 8th, iron studies were done which showed a ferritin of 141 with iron saturation of 43%.  She does have a low MCV because of the thalassemia.  She is working now.  She works actually right down the street from [place].  She is having heavy cycles right now.  They typically last 5 days.  PHYSICAL EXAMINATION:  General:  This is a fairly well-developed, well- nourished African American female in no obvious distress.  Vital signs show temperature of 98, pulse 73, respiratory rate 14, blood pressure 106/72.  Weight is 195 pounds.  Head and neck exam  show a normocephalic atraumatic skull.  There are no ocular or oral lesions.  There are no palpable cervical or supraclavicular lymph nodes.  Lungs:  Clear to percussion and auscultation bilaterally.  Cardiac:  Regular rate and rhythm with a normal S1 and S2.  She has a 1/6 systolic ejection murmur. Abdomen:  Soft.  She has an implantable port in the left upper quadrant from lap banding.  She has no fluid wave.  There is no palpable hepatosplenomegaly.  Back:  No tenderness over the spine, ribs, or hips. Extremities:  Show no clubbing, cyanosis, or edema.  Skin:   Shows no rashes, ecchymoses or petechia.  Neurological:  Shows no focal neurological deficits.  LABORATORY STUDIES:  White cell count is 4.2, hemoglobin 11.2, hematocrit 35.6, platelet count 269.  MCV is 75.  Peripheral smear shows some mild anisocytosis and poikilocytosis.  She has some target cells.  There is some microcytic red cells.  She has no inclusion bodies.  There is no nucleated red blood cells.  I see no spherocytes or schistocytes.  There is no rouleaux formation.  White cells appear normal in morphology and maturation.  There is no immature myeloid or lymphoid forms.  There is no atypical lymphocytes.  Platelets are adequate in number and size.  IMPRESSION:  Ms. Stensland is a very nice 42 year old African American female.  She has iron-deficiency anemia from menometrorrhagia.  She also has alpha thalassemia.  I am unsure was to what is going on with her with these episodes.  I do think that it probably would be worth while checking her for pheochromocytoma.  I also want to make sure we check her for carcinoid syndrome.  We will go ahead and get a 24-hour urine on her for metanephrines and also 24 urine for 5-HIAA.  I do not see a need for any kind of scans right now.  We will see what her iron studies show.  They were okay 3 weeks ago, so I cannot imagine them being too  bad right now, although she is having her cycles.  I looked at her blood smear.  I went over my recommendations with her. I spent about a half hour with her.  I want to see her back in another couple months.  We will certainly see her back sooner if we find some abnormalities with her lab work.    ______________________________ Josph Macho, M.D. PRE/MEDQ  D:  05/11/2013  T:  05/12/2013  Job:  [id number]

## 2013-05-15 ENCOUNTER — Other Ambulatory Visit: Payer: Self-pay | Admitting: Hematology & Oncology

## 2013-05-15 DIAGNOSIS — R232 Flushing: Secondary | ICD-10-CM

## 2013-05-16 ENCOUNTER — Telehealth: Payer: Self-pay | Admitting: Nurse Practitioner

## 2013-05-16 NOTE — Telephone Encounter (Signed)
Message copied by Glee Arvin on Tue May 16, 2013 10:34 AM ------      Message from: Josph Macho      Created: Sun May 14, 2013  7:55 PM       Call - iron level is ok!!  Cindee Lame ------

## 2013-05-25 ENCOUNTER — Encounter (HOSPITAL_COMMUNITY): Payer: Self-pay | Admitting: Emergency Medicine

## 2013-05-25 ENCOUNTER — Emergency Department (HOSPITAL_COMMUNITY)
Admission: EM | Admit: 2013-05-25 | Discharge: 2013-05-25 | Disposition: A | Discharge status: Home / Self Care | Payer: Medicaid Other | Attending: Emergency Medicine | Admitting: Emergency Medicine

## 2013-05-25 ENCOUNTER — Emergency Department (HOSPITAL_COMMUNITY): Payer: Medicaid Other

## 2013-05-25 DIAGNOSIS — Z8739 Personal history of other diseases of the musculoskeletal system and connective tissue: Secondary | ICD-10-CM | POA: Insufficient documentation

## 2013-05-25 DIAGNOSIS — Z862 Personal history of diseases of the blood and blood-forming organs and certain disorders involving the immune mechanism: Secondary | ICD-10-CM | POA: Insufficient documentation

## 2013-05-25 DIAGNOSIS — M7989 Other specified soft tissue disorders: Secondary | ICD-10-CM | POA: Insufficient documentation

## 2013-05-25 DIAGNOSIS — R0602 Shortness of breath: Secondary | ICD-10-CM | POA: Insufficient documentation

## 2013-05-25 DIAGNOSIS — R55 Syncope and collapse: Secondary | ICD-10-CM | POA: Insufficient documentation

## 2013-05-25 DIAGNOSIS — Z79899 Other long term (current) drug therapy: Secondary | ICD-10-CM | POA: Insufficient documentation

## 2013-05-25 DIAGNOSIS — R5381 Other malaise: Secondary | ICD-10-CM | POA: Insufficient documentation

## 2013-05-25 DIAGNOSIS — Z86718 Personal history of other venous thrombosis and embolism: Secondary | ICD-10-CM | POA: Insufficient documentation

## 2013-05-25 DIAGNOSIS — M79609 Pain in unspecified limb: Secondary | ICD-10-CM

## 2013-05-25 DIAGNOSIS — R42 Dizziness and giddiness: Secondary | ICD-10-CM | POA: Insufficient documentation

## 2013-05-25 LAB — COMPREHENSIVE METABOLIC PANEL
ALT: 9 U/L (ref 0–35)
AST: 14 U/L (ref 0–37)
BUN: 6 mg/dL (ref 6–23)
CO2: 26 mEq/L (ref 19–32)
Calcium: 9.2 mg/dL (ref 8.4–10.5)
Chloride: 103 mEq/L (ref 96–112)
Creatinine, Ser: 0.7 mg/dL (ref 0.50–1.10)
GFR calc non Af Amer: >90 mL/min (ref >90)
Sodium: 139 mEq/L (ref 135–145)
Total Bilirubin: 0.2 mg/dL — ABNORMAL LOW (ref 0.3–1.2)

## 2013-05-25 LAB — TROPONIN I: Troponin I: <0.3 ng/mL (ref <0.30)

## 2013-05-25 LAB — CBC
HCT: 33.2 % — ABNORMAL LOW (ref 36.0–46.0)
Hemoglobin: 10.5 g/dL — ABNORMAL LOW (ref 12.0–15.0)
MCHC: 31.6 g/dL (ref 30.0–36.0)
MCV: 74.1 fL — ABNORMAL LOW (ref 78.0–100.0)
RDW: 13.6 % (ref 11.5–15.5)
WBC: 5.1 10*3/uL (ref 4.0–10.5)

## 2013-05-25 LAB — GLUCOSE, CAPILLARY: Glucose-Capillary: 97 mg/dL (ref 70–99)

## 2013-05-25 MED ORDER — IOHEXOL 350 MG/ML SOLN
100.0000 mL | Freq: Once | INTRAVENOUS | Status: AC | PRN
  Administered 2013-05-25: 100 mL via INTRAVENOUS

## 2013-05-25 NOTE — ED Notes (Signed)
Pt reports having sob, fatigue, feeling lightheaded and dizzy x 1 month but then had syncopal episode this am at work.

## 2013-05-25 NOTE — Progress Notes (Signed)
VASCULAR LAB PRELIMINARY  PRELIMINARY  PRELIMINARY  PRELIMINARY  Right lower extremity venous duplex has been completed.  Preliminary report:  Right leg is negative for deep and superficial vein thrombosis. One of the right peroneal veins is not visualized from mid calf up to knee.  Uncertain of clinical significance.    The patient has a positive exam done at [facility] 06/01/2012 which demonstrated DVT of the gastrocnemius veins.  Today's exam demonstrates complete resolution of prior DVT.  Alena Blankenbeckler, RVT 05/25/2013, 4:57 PM

## 2013-05-25 NOTE — ED Provider Notes (Signed)
CSN: [id number]     Arrival date & time 05/25/13  1256 History   First MD Initiated Contact with Patient 05/25/13 1536     Chief Complaint  Patient presents with  . Loss of Consciousness  . Shortness of Breath   (Consider location/radiation/quality/duration/timing/severity/associated sxs/prior Treatment) HPI Comments: Shouldn't presents to the ER for evaluation of progressively worsening shortness of breath. Patient reports symptoms began one month ago. Patient reports that the shortness of breath waxes and wanes, comes and goes. She is not identified alleviating or exacerbating factors. She has not had dyspnea on exertion, at times get short of breath at rest, other times can exert herself without shortness of breath. There is no associated chest pain with shortness of breath.  Patient reports that this morning her symptoms worsened. She started feeling more short of breath, fatigued, lightheaded and dizzy. She then briefly passed out while at work. This is the first time she has had syncope with these symptoms.  Patient reports a previous history of thalassemia, but reports that her hemoglobin has been "the best it has ever been". She also has history of DVT. She reports worsening right leg pain, but did have evaluation for this couple of months ago and there was no recurrent DVT. She is not currently on anticoagulation.  Patient says she has had some forgetfulness associated with the symptoms, but no headaches. She has not noticed any focal numbness, tingling or weakness of the extremities or unilateral symptoms.  Patient is a 42 y.o. female presenting with syncope and shortness of breath.  Loss of Consciousness Associated symptoms: dizziness and shortness of breath   Associated symptoms: no chest pain and no headaches   Shortness of Breath Associated symptoms: syncope   Associated symptoms: no chest pain and no headaches     Past Medical History  Diagnosis Date  . Fibromyalgia   .  Thalassanemia   . Medial meniscus tear, right knee 05/20/2012  . Lateral meniscus tear 05/20/2012  . DVT (deep venous thrombosis)    Past Surgical History  Procedure Laterality Date  . Knee surgery      L knee  . Laparoscopic gastric banding    . Dilation and curettage of uterus    . Knee arthroscopy with lateral menisectomy  05/20/2012    Procedure: KNEE ARTHROSCOPY WITH LATERAL MENISECTOMY;  Surgeon: Eulas Post, MD;  Location: [facility];  Service: Orthopedics;  Laterality: Right;  RIGHT KNEE ARTHROSCOPY LATERAL AND MEDIAL MENISCECTOMY   . Fatty tumor removed    . Hernia repair     Family History  Problem Relation Age of Onset  . Diabetes Mother   . Cancer Father    History  Substance Use Topics  . Smoking status: Never Smoker   . Smokeless tobacco: Not on file  . Alcohol Use: Yes     Comment: occasionally   OB History   Grav Para Term Preterm Abortions TAB SAB Ect Mult Living   3 2 2  1  1   2      Review of Systems  Constitutional: Positive for fatigue.  Respiratory: Positive for shortness of breath.   Cardiovascular: Positive for leg swelling and syncope. Negative for chest pain.  Neurological: Positive for dizziness, syncope and light-headedness. Negative for headaches.  All other systems reviewed and are negative.    Allergies  Sulfa antibiotics  Home Medications   Current Outpatient Rx  Name  Route  Sig  Dispense  Refill  . ibuprofen (ADVIL,MOTRIN)  600 MG tablet   Oral   Take 600 mg by mouth as needed for pain. For pain         . Multiple Vitamin (MULTIVITAMIN WITH MINERALS) TABS   Oral   Take 1 tablet by mouth daily.         Marland Kitchen oxyCODONE (OXYCONTIN) 10 MG 12 hr tablet   Oral   Take 10 mg by mouth as needed for pain. For pain         . traMADol (ULTRAM) 50 MG tablet   Oral   Take 50 mg by mouth 3 (three) times daily. scheduled          BP 115/74  Pulse 97  Temp(Src) 98.4 F (36.9 C) (Oral)  Resp 18  Ht 5\' 7"  (1.702  m)  Wt 195 lb 8 oz (88.678 kg)  BMI 30.61 kg/m2  SpO2 98%  LMP 05/13/2013 Physical Exam  Constitutional: She is oriented to person, place, and time. She appears well-developed and well-nourished. No distress.  HENT:  Head: Normocephalic and atraumatic.  Right Ear: Hearing normal.  Left Ear: Hearing normal.  Nose: Nose normal.  Mouth/Throat: Oropharynx is clear and moist and mucous membranes are normal.  Eyes: Conjunctivae and EOM are normal. Pupils are equal, round, and reactive to light.  Neck: Normal range of motion. Neck supple.  Cardiovascular: Regular rhythm, S1 normal and S2 normal.  Exam reveals no gallop and no friction rub.   No murmur heard. Pulmonary/Chest: Effort normal and breath sounds normal. No respiratory distress. She exhibits no tenderness.  Abdominal: Soft. Normal appearance and bowel sounds are normal. There is no hepatosplenomegaly. There is no tenderness. There is no rebound, no guarding, no tenderness at McBurney's point and negative Murphy's sign. No hernia.  Musculoskeletal: Normal range of motion.       Right lower leg: She exhibits tenderness.  Neurological: She is alert and oriented to person, place, and time. She has normal strength. No cranial nerve deficit or sensory deficit. Coordination normal. GCS eye subscore is 4. GCS verbal subscore is 5. GCS motor subscore is 6.  Skin: Skin is warm, dry and intact. No rash noted. No cyanosis.  Psychiatric: She has a normal mood and affect. Her speech is normal and behavior is normal. Thought content normal.    ED Course  Procedures (including critical care time) Labs Review Labs Reviewed  COMPREHENSIVE METABOLIC PANEL - Abnormal; Notable for the following:    Total Bilirubin 0.2 (*)    All other components within normal limits  CBC - Abnormal; Notable for the following:    Hemoglobin 10.5 (*)    HCT 33.2 (*)    MCV 74.1 (*)    MCH 23.4 (*)    All other components within normal limits  GLUCOSE, CAPILLARY   PRO B NATRIURETIC PEPTIDE  TROPONIN I   Imaging Review Ct Angio Chest Pe W/cm &/or Wo Cm  05/25/2013   CLINICAL DATA:  Chest tightness and shortness of breath. History of a DVT 1 year ago.  EXAM: CT ANGIOGRAPHY CHEST WITH CONTRAST  TECHNIQUE: Multidetector CT imaging of the chest was performed using the standard protocol during bolus administration of intravenous contrast. Multiplanar CT image reconstructions including MIPs were obtained to evaluate the vascular anatomy.  CONTRAST:  OMNIPAQUE IOHEXOL 350 MG/ML SOLN  COMPARISON:  None.  FINDINGS: No evidence of a pulmonary embolus.  The heart is normal in size and configuration. The great vessels are unremarkable. No mediastinal or hilar masses or  pathologically enlarged lymph nodes are seen.  There is dependent subsegmental atelectasis. The lungs are otherwise clear. No pleural effusion or pneumothorax.  There is a partly imaged lap band in the epigastric region, which appears well positioned. Limited visualization of the upper abdomen is otherwise unremarkable.  No significant bony abnormality.  Review of the MIP images confirms the above findings.  IMPRESSION: 1. No evidence of a pulmonary embolus. 2. No acute findings. 3. Mild dependent subsegmental atelectasis. The lungs are otherwise clear.   Electronically Signed   By: Amie Portland M.D.   On: 05/25/2013 18:19    EKG Interpretation     Ventricular Rate:  89 PR Interval:  194 QRS Duration: 86 QT Interval:  348 QTC Calculation: 423 R Axis:   49 Text Interpretation:  Normal sinus rhythm Septal infarct , age undetermined Abnormal ECG No previous tracing            MDM  Diagnosis: 1. Syncope 2. Anxiety  Patient presents to the ER for evaluation of syncope. Patient reports that she has been feeling fatigued and short of breath for approximately one month. She has had intermittent episodes of dizziness. She has not identified anything that causes symptoms. She does report that  she has experienced anxiety in conjunction with these symptoms. She had increased symptoms today and then had an episode of brief syncope. Her neurologic examination is normal. Workup here in the ER including cardiac workup, CT head, PE workup overall negative. She does complain of right leg pain with history of DVT, duplex was negative as well. Patient continues to do well here in the ER with a normal exam. Based on her normal workup, she is appropriate for outpatient continued followup. She will return to the ER for worsening symptoms.    Gilda Crease, MD 05/25/13 2036

## 2013-05-27 LAB — METANEPHRINES, URINE, 24 HOUR
Metaneph Total, Ur: 372 mcg/24 h (ref 182–739)
Metanephrines, Ur: 92 mcg/24 h (ref 58–203)
Normetanephrine, 24H Ur: 280 mcg/24 h (ref 88–649)
Volume, Urine-METAN: 450 mL

## 2013-05-29 LAB — 5 HIAA, QUANTITATIVE, URINE, 24 HOUR

## 2013-05-30 ENCOUNTER — Telehealth: Payer: Self-pay | Admitting: Nurse Practitioner

## 2013-05-30 NOTE — Telephone Encounter (Addendum)
Message copied by Glee Arvin on Tue May 30, 2013 10:07 AM ------      Message from: Arlan Organ R      Created: Mon May 29, 2013  5:47 PM       Call - urine studies are normal!!  No obvious issues that i can see!! pete ------Informed pt of above results per Dr. Myna Hidalgo. Pt verbalized understanding and appreciation.

## 2013-06-01 ENCOUNTER — Ambulatory Visit: Payer: Self-pay | Admitting: Obstetrics

## 2013-06-07 ENCOUNTER — Other Ambulatory Visit: Payer: Self-pay | Admitting: Internal Medicine

## 2013-06-07 DIAGNOSIS — E049 Nontoxic goiter, unspecified: Secondary | ICD-10-CM

## 2013-06-13 ENCOUNTER — Ambulatory Visit (HOSPITAL_COMMUNITY)
Admission: RE | Admit: 2013-06-13 | Discharge: 2013-06-13 | Disposition: A | Discharge status: Home / Self Care | Payer: Medicaid Other | Source: Ambulatory Visit | Attending: Internal Medicine | Admitting: Internal Medicine

## 2013-06-13 ENCOUNTER — Ambulatory Visit: Payer: Medicaid Other | Admitting: Obstetrics

## 2013-06-13 DIAGNOSIS — E049 Nontoxic goiter, unspecified: Secondary | ICD-10-CM

## 2013-06-13 DIAGNOSIS — E042 Nontoxic multinodular goiter: Secondary | ICD-10-CM | POA: Insufficient documentation

## 2013-06-15 ENCOUNTER — Encounter: Payer: Self-pay | Admitting: Obstetrics

## 2013-06-15 ENCOUNTER — Ambulatory Visit: Payer: Medicaid Other | Admitting: Obstetrics

## 2013-06-15 ENCOUNTER — Ambulatory Visit (INDEPENDENT_AMBULATORY_CARE_PROVIDER_SITE_OTHER): Payer: Medicaid Other | Admitting: Obstetrics

## 2013-06-15 VITALS — BP 124/85 | HR 65 | Ht 67.0 in | Wt 198.0 lb

## 2013-06-15 DIAGNOSIS — Z113 Encounter for screening for infections with a predominantly sexual mode of transmission: Secondary | ICD-10-CM

## 2013-06-15 DIAGNOSIS — N390 Urinary tract infection, site not specified: Secondary | ICD-10-CM

## 2013-06-15 DIAGNOSIS — N760 Acute vaginitis: Secondary | ICD-10-CM

## 2013-06-15 DIAGNOSIS — N943 Premenstrual tension syndrome: Secondary | ICD-10-CM

## 2013-06-15 DIAGNOSIS — Z1239 Encounter for other screening for malignant neoplasm of breast: Secondary | ICD-10-CM

## 2013-06-15 LAB — POCT URINALYSIS DIPSTICK
Blood, UA: NEGATIVE
Glucose, UA: NEGATIVE
Ketones, UA: NEGATIVE
Spec Grav, UA: 1.015
Urobilinogen, UA: NEGATIVE

## 2013-06-15 MED ORDER — PAROXETINE HCL 20 MG PO TABS: 20.0000 mg | ORAL_TABLET | Freq: Every day | ORAL | Status: DC

## 2013-06-15 NOTE — Progress Notes (Signed)
  Subjective:    Meghan Solis is a 42 y.o. female who presents for sexually transmitted disease check. Sexual history reviewed with the patient. STI Exposure: denies knowledge of risky exposure. Previous history of STI none. Current symptoms vaginal discharge: white, yellow, thick and malodorous. Pt states she is also having recurrent UTI. Pt states she is having some lower back pain. Pt states she is also having issues with constipation.  Contraception: none Menstrual History: OB History   Grav Para Term Preterm Abortions TAB SAB Ect Mult Living   3 2 2  1  1   2       Menarche age: 64 Patient's last menstrual period was 06/11/2013.    The following portions of the patient's history were reviewed and updated as appropriate: allergies, current medications, past family history, past medical history, past social history, past surgical history and problem list.  Review of Systems Pertinent items are noted in HPI.    Objective:    BP 124/85  Pulse 65  Ht 5\' 7"  (1.702 m)  Wt 198 lb (89.812 kg)  BMI 31.00 kg/m2  LMP 06/11/2013 General:   alert and no distress  Lymph Nodes:   Cervical, supraclavicular, and axillary nodes normal.  Pelvis:  Vulva and vagina appear normal. Bimanual exam reveals normal uterus and adnexa.  Cultures:  GC and Chlamydia genprobes, urine culture and wet prep     Assessment:    Very low risk of STD exposure   PMS   Plan:    Discussed safe sexual practice in detail See orders for STD cultures and assays Will call pt with results Paxil Rx RTC in 3 months or PRN.

## 2013-06-16 ENCOUNTER — Encounter: Payer: Self-pay | Admitting: Obstetrics

## 2013-06-16 DIAGNOSIS — N760 Acute vaginitis: Secondary | ICD-10-CM | POA: Insufficient documentation

## 2013-06-16 LAB — GC/CHLAMYDIA PROBE AMP
CT Probe RNA: NEGATIVE
GC Probe RNA: NEGATIVE

## 2013-06-16 LAB — WET PREP BY MOLECULAR PROBE
Candida species: NEGATIVE
Trichomonas vaginosis: NEGATIVE

## 2013-06-17 LAB — URINE CULTURE: Colony Count: >=100000

## 2013-06-22 ENCOUNTER — Other Ambulatory Visit: Payer: Self-pay | Admitting: *Deleted

## 2013-06-22 MED ORDER — METRONIDAZOLE 500 MG PO TABS: 500.0000 mg | ORAL_TABLET | Freq: Two times a day (BID) | ORAL | Status: DC

## 2013-06-27 ENCOUNTER — Other Ambulatory Visit: Payer: Self-pay | Admitting: *Deleted

## 2013-06-27 DIAGNOSIS — B9689 Other specified bacterial agents as the cause of diseases classified elsewhere: Secondary | ICD-10-CM

## 2013-06-27 DIAGNOSIS — N390 Urinary tract infection, site not specified: Secondary | ICD-10-CM

## 2013-06-27 MED ORDER — CIPROFLOXACIN HCL 500 MG PO TABS: 500.0000 mg | ORAL_TABLET | Freq: Two times a day (BID) | ORAL | Status: DC

## 2013-06-27 MED ORDER — METRONIDAZOLE 500 MG PO TABS: 500.0000 mg | ORAL_TABLET | Freq: Two times a day (BID) | ORAL | Status: DC

## 2013-07-03 ENCOUNTER — Other Ambulatory Visit: Payer: Self-pay | Admitting: *Deleted

## 2013-07-03 DIAGNOSIS — B379 Candidiasis, unspecified: Secondary | ICD-10-CM

## 2013-07-03 MED ORDER — FLUCONAZOLE 150 MG PO TABS: 150.0000 mg | ORAL_TABLET | Freq: Once | ORAL | Status: DC

## 2013-07-12 ENCOUNTER — Other Ambulatory Visit: Payer: Medicaid Other | Admitting: Lab

## 2013-07-12 ENCOUNTER — Ambulatory Visit: Payer: Medicaid Other | Admitting: Hematology & Oncology

## 2013-08-08 ENCOUNTER — Other Ambulatory Visit (HOSPITAL_BASED_OUTPATIENT_CLINIC_OR_DEPARTMENT_OTHER): Payer: Medicaid Other | Admitting: Lab

## 2013-08-08 ENCOUNTER — Encounter: Payer: Self-pay | Admitting: Hematology & Oncology

## 2013-08-08 ENCOUNTER — Ambulatory Visit (HOSPITAL_BASED_OUTPATIENT_CLINIC_OR_DEPARTMENT_OTHER): Payer: Medicaid Other | Admitting: Hematology & Oncology

## 2013-08-08 VITALS — BP 125/78 | HR 77 | Temp 98.2°F | Resp 14 | Ht 67.0 in | Wt 205.0 lb

## 2013-08-08 DIAGNOSIS — A499 Bacterial infection, unspecified: Secondary | ICD-10-CM

## 2013-08-08 DIAGNOSIS — D509 Iron deficiency anemia, unspecified: Secondary | ICD-10-CM

## 2013-08-08 DIAGNOSIS — D560 Alpha thalassemia: Secondary | ICD-10-CM

## 2013-08-08 DIAGNOSIS — D649 Anemia, unspecified: Secondary | ICD-10-CM

## 2013-08-08 DIAGNOSIS — N390 Urinary tract infection, site not specified: Secondary | ICD-10-CM

## 2013-08-08 LAB — CBC WITH DIFFERENTIAL (CANCER CENTER ONLY)
BASO#: 0 10*3/uL (ref 0.0–0.2)
BASO%: 0.6 % (ref 0.0–2.0)
EOS%: 4.1 % (ref 0.0–7.0)
Eosinophils Absolute: 0.2 10*3/uL (ref 0.0–0.5)
HCT: 36.5 % (ref 34.8–46.6)
HGB: 11.4 g/dL — ABNORMAL LOW (ref 11.6–15.9)
LYMPH#: 1.6 10*3/uL (ref 0.9–3.3)
LYMPH%: 32.8 % (ref 14.0–48.0)
MCH: 22.8 pg — ABNORMAL LOW (ref 26.0–34.0)
MCHC: 31.2 g/dL — AB (ref 32.0–36.0)
MCV: 73 fL — AB (ref 81–101)
MONO#: 0.4 10*3/uL (ref 0.1–0.9)
MONO%: 8 % (ref 0.0–13.0)
NEUT#: 2.7 10*3/uL (ref 1.5–6.5)
NEUT%: 54.5 % (ref 39.6–80.0)
Platelets: 241 10*3/uL (ref 145–400)
RBC: 5.01 10*6/uL (ref 3.70–5.32)
RDW: 13.7 % (ref 11.1–15.7)
WBC: 4.9 10*3/uL (ref 3.9–10.0)

## 2013-08-08 LAB — FERRITIN CHCC: Ferritin: 49 ng/ml (ref 9–269)

## 2013-08-08 LAB — IRON AND TIBC CHCC
%SAT: 24 % (ref 21–57)
Iron: 58 ug/dL (ref 41–142)
TIBC: 245 ug/dL (ref 236–444)
UIBC: 187 ug/dL (ref 120–384)

## 2013-08-08 LAB — CHCC SATELLITE - SMEAR

## 2013-08-08 NOTE — Progress Notes (Signed)
This office note has been dictated.

## 2013-08-09 ENCOUNTER — Other Ambulatory Visit: Payer: Self-pay | Admitting: Nurse Practitioner

## 2013-08-09 ENCOUNTER — Telehealth: Payer: Self-pay | Admitting: Nurse Practitioner

## 2013-08-09 DIAGNOSIS — D649 Anemia, unspecified: Secondary | ICD-10-CM

## 2013-08-09 NOTE — Progress Notes (Signed)
DIAGNOSES: 1. Iron-deficiency anemia. 2. Alpha thalassemia.  CURRENT THERAPY:  IV iron as indicated.  INTERIM HISTORY:  Ms. Amara come in for followup.  She is doing fairly well.  Unfortunately, she now has Helicobacter infection in her stomach. She was found to have this about 3 weeks ago.  She is on medications for this.  As far as her iron goes, her last iron was given back in June.  She has had no problems with respect to bleeding.  She does have her monthly cycles.  These appear to be somewhat heavy.  Back in October when we last saw her, her ferritin was 1.2 with an iron saturation of 42%.  She is complaining of recurrent urinary tract infections.  She says she has had 12 over the past year.  She wants to refer to Urology.  We will see about making a referral to Urology.  She has had no problems with fevers, sweats, or chills.  She has had some flank pain on occasion.  She has had no leg swelling.  There has been no fever, sweats, or chills.  She has had no cough or shortness of breath.  PHYSICAL EXAMINATION:  General:  This is a well-developed, well- nourished African American female in no obvious distress.  Vital Signs: Temperature of 98.2, pulse 77, respiratory rate 14, blood pressure 125/78, weight is 205 pounds.  Head and Neck:  Normocephalic, atraumatic skull.  There are no ocular or oral lesions.  There are no palpable cervical or supraclavicular lymph nodes.  Lungs:  Clear bilaterally. Cardiac:  Regular rate and rhythm with a normal S1 and S2.  There are no murmurs, rubs, or bruits.  Abdomen:  Soft.  She has good bowel sounds. There is no fluid wave.  There is no palpable hepatosplenomegaly.  Back: No tenderness over the spine, ribs, or hips.  Extremities:  No clubbing, cyanosis, or edema.  Skin:  No rashes, ecchymosis, or petechia.  LABORATORY STUDIES:  White cell count is 5.1, hemoglobin 10.5, hematocrit 33.2, platelet count 237.  MCV is 74.  IMPRESSION:   Ms. Wheller is very charming 43 year old African American female.  She has iron-deficiency anemia.  I also believe that she has thalassemia.  I do not think we have done thalassemia test on her.  We will have to go ahead and get this done today.  We will see about making a referral to Urology.  We will probably need to give her iron.  I suspect that her iron probably is dropping.  I will give a call with the results.  We will plan to get her back in about 6 weeks' time.  Again, I likely will plan to give her iron as I think her iron levels are on the low side.    ______________________________ Volanda Napoleon, M.D. PRE/MEDQ  D:  08/08/2013  T:  08/09/2013  Job:  [id number]

## 2013-08-09 NOTE — Telephone Encounter (Addendum)
Message copied by Jimmy Footman on Wed Aug 09, 2013  1:24 PM ------      Message from: Burney Gauze R      Created: Tue Aug 08, 2013  2:12 PM       Call - iron is dropping!!  Need Feraheme at 1020mg  x 1 dose.  Please set up!!  Pete ------Pt verbalized understanding and appreciation. Appointment has been set up for 2/9 @1030 .

## 2013-08-14 ENCOUNTER — Telehealth: Payer: Self-pay | Admitting: Hematology & Oncology

## 2013-08-14 ENCOUNTER — Ambulatory Visit: Payer: Medicaid Other | Admitting: Diagnostic Neuroimaging

## 2013-08-14 NOTE — Telephone Encounter (Signed)
Pt moved 2-9 to 2-16. I left RN message

## 2013-08-21 ENCOUNTER — Ambulatory Visit (INDEPENDENT_AMBULATORY_CARE_PROVIDER_SITE_OTHER): Payer: Medicaid Other | Admitting: Diagnostic Neuroimaging

## 2013-08-21 ENCOUNTER — Encounter: Payer: Self-pay | Admitting: Diagnostic Neuroimaging

## 2013-08-21 ENCOUNTER — Ambulatory Visit: Payer: Medicaid Other

## 2013-08-21 VITALS — BP 120/84 | HR 80 | Temp 97.7°F | Ht 67.0 in | Wt 214.0 lb

## 2013-08-21 DIAGNOSIS — R29898 Other symptoms and signs involving the musculoskeletal system: Secondary | ICD-10-CM

## 2013-08-21 DIAGNOSIS — R413 Other amnesia: Secondary | ICD-10-CM

## 2013-08-21 DIAGNOSIS — G3184 Mild cognitive impairment, so stated: Secondary | ICD-10-CM

## 2013-08-21 DIAGNOSIS — R55 Syncope and collapse: Secondary | ICD-10-CM

## 2013-08-21 LAB — RETICULOCYTES (CHCC)
ABS Retic: 65.8 10*3/uL (ref 19.0–186.0)
RBC.: 5.06 MIL/uL (ref 3.87–5.11)
RETIC CT PCT: 1.3 % (ref 0.4–2.3)

## 2013-08-21 LAB — ALPHA-THALASSEMIA GENOTYPR

## 2013-08-21 NOTE — Patient Instructions (Signed)
I will check MRI and lab testing. 

## 2013-08-21 NOTE — Progress Notes (Signed)
[facility]  PATIENT: Meghan Solis DOB: [birth year]  REFERRING CLINICIAN: Mckenzie HISTORY FROM: patient  REASON FOR VISIT: new consult   HISTORICAL  CHIEF COMPLAINT:  Chief Complaint  Patient presents with  . Dizziness    HISTORY OF PRESENT ILLNESS:   43 year old right-handed female with history of iron deficiency anemia, alpha thalassemia, status post lap band surgery, status post bilateral meniscus tear surgeries, with fibromyalgia, depression, anxiety, here for evaluation of syncope, confusion and cognitive decline.  November 2014 patient was at work, sitting down, when she began to feel dizzy and shortness of breath. She went to the bathroom, felt lightheaded and felt as though passing out. She was accompanied to the bathroom by coworker who is helping her. No convulsions, tongue biting or incontinence. Patient rapidly returned to consciousness. She's taken to the hospital and evaluated.  For past 5-6 months this does have a intermittent word finding difficulties, memory loss, foggy sensation, cognitive difficulty. She's having difficulty with multitasking and short-term recall.  Over past few months patient has had increasing lower extremity weakness, numbness, nerve pain in her groin.   REVIEW OF SYSTEMS: Full 14 system review of systems performed and notable only for this his legs confusion numbness weakness dizziness looking cramps muscle for infection in the knees he was easily palpitations murmur weight gain.  ALLERGIES: Allergies  Allergen Reactions  . Sulfa Antibiotics Swelling    Mouth swells    HOME MEDICATIONS: Outpatient Prescriptions Prior to Visit  Medication Sig Dispense Refill  . gabapentin (NEURONTIN) 800 MG tablet Take 800 mg by mouth 3 (three) times daily.      . Multiple Vitamin (MULTIVITAMIN WITH MINERALS) TABS Take 1 tablet by mouth daily.      . traMADol (ULTRAM) 50 MG tablet Take 50 mg by mouth 3 (three) times daily.         Marland Kitchen docusate sodium (COLACE) 100 MG capsule Take 100 mg by mouth as needed.       . fluticasone (FLONASE) 50 MCG/ACT nasal spray Place 1 spray into both nostrils daily as needed for allergies or rhinitis.      . Oxycodone HCl 10 MG TABS Take 10 mg by mouth daily as needed (pain).       No facility-administered medications prior to visit.    PAST MEDICAL HISTORY: Past Medical History  Diagnosis Date  . Fibromyalgia   . Thalassanemia   . Medial meniscus tear, right knee 05/20/2012  . Lateral meniscus tear 05/20/2012  . DVT (deep venous thrombosis)     PAST SURGICAL HISTORY: Past Surgical History  Procedure Laterality Date  . Knee surgery      L knee  . Laparoscopic gastric banding    . Dilation and curettage of uterus    . Knee arthroscopy with lateral menisectomy  05/20/2012    Procedure: KNEE ARTHROSCOPY WITH LATERAL MENISECTOMY;  Surgeon: Johnny Bridge, MD;  Location: [place];  Service: Orthopedics;  Laterality: Right;  RIGHT KNEE ARTHROSCOPY LATERAL AND MEDIAL MENISCECTOMY   . Fatty tumor removed    . Hernia repair      FAMILY HISTORY: Family History  Problem Relation Age of Onset  . Diabetes Mother   . Cancer Father     SOCIAL HISTORY:  History   Social History  . Marital Status: Single    Spouse Name: N/A    Number of Children: 2  . Years of Education: College   Occupational History  .  Other  [employer]   Social History Main Topics  . Smoking status: Never Smoker   . Smokeless tobacco: Never Used  . Alcohol Use: Yes     Comment: occasionally: once a month  . Drug Use: No  . Sexual Activity: Yes    Partners: Male    Birth Control/ Protection: None   Other Topics Concern  . Not on file   Social History Narrative   Patient lives at home with family.   Caffeine Use: 1/2 a cup occasionally     PHYSICAL EXAM  Filed Vitals:   08/21/13 1022 08/21/13 1037 08/21/13 1038  BP:  118/79 120/84  Pulse:  74 80  Temp: 97.7 F  (36.5 C)    TempSrc: Oral    Height: 5\' 7"  (1.702 m)    Weight: 214 lb (97.07 kg)      Not recorded    Body mass index is 33.51 kg/(m^2).  GENERAL EXAM: Patient is in no distress; well developed, nourished and groomed; neck is supple  CARDIOVASCULAR: Regular rate and rhythm, no murmurs, no carotid bruits  NEUROLOGIC: MENTAL STATUS: awake, alert, oriented to person, place and time, recent and remote memory intact, normal attention and concentration, language fluent, comprehension intact, naming intact, fund of knowledge appropriate CRANIAL NERVE: no papilledema on fundoscopic exam, pupils equal and reactive to light, visual fields full to confrontation, extraocular muscles intact, SACCADIC BREAKDOWN OF SMOOTH PURSUIT, NYSTAGMUS ON LET GAZE, facial sensation and strength symmetric, hearing intact, palate elevates symmetrically, uvula midline, shoulder shrug symmetric, tongue midline. MOTOR: normal bulk and tone, full strength in the BUE, BLE SENSORY: normal and symmetric to light touch, temperature, vibration COORDINATION: finger-nose-finger NOTABLE FOR LUE TREMOR REFLEXES: deep tendon reflexes present and symmetric GAIT/STATION: ANTALGIC GAIT, LIMPING ON RIGHT KNEE, DIFF WITH TOE, HEEL AND TANDEM GAIT. ROMBERG NEG.    DIAGNOSTIC DATA (LABS, IMAGING, TESTING) - I reviewed patient records, labs, notes, testing and imaging myself where available.  Lab Results  Component Value Date   WBC 4.9 08/08/2013   HGB 11.4* 08/08/2013   HCT 36.5 08/08/2013   MCV 73* 08/08/2013   PLT 241 08/08/2013      Component Value Date/Time   NA 139 05/25/2013 1554   K 3.6 05/25/2013 1554   CL 103 05/25/2013 1554   CO2 26 05/25/2013 1554   GLUCOSE 92 05/25/2013 1554   BUN 6 05/25/2013 1554   CREATININE 0.70 05/25/2013 1554   CALCIUM 9.2 05/25/2013 1554   PROT 7.1 05/25/2013 1554   ALBUMIN 3.6 05/25/2013 1554   AST 14 05/25/2013 1554   ALT 9 05/25/2013 1554   ALKPHOS 72 05/25/2013 1554   BILITOT  0.2* 05/25/2013 1554   GFRNONAA >90 05/25/2013 1554   GFRAA >90 05/25/2013 1554   No results found for this basename: CHOL, HDL, LDLCALC, LDLDIRECT, TRIG, CHOLHDL   No results found for this basename: HGBA1C   Lab Results  Component Value Date   VITAMINB12 468 11/04/2012   No results found for this basename: TSH      ASSESSMENT AND PLAN  43 y.o. year old female here with 5-6 month history of cognitive decline, mixing up words, memory loss, with 2 episodes of syncope in her life, now with leg weakness. No definite unifying diagnosis. Syncopal event 2012 was related to esophageal spasm. More recent syncopal event is less clear. Lower extremity symptoms likely related to orthopedic issues, and knee issues. Cognitive decline could be related to depression, anxiety, fibromyalgia. We'll check MRI of the  brain to rule out other CNS etiologies (autoimmune, inflamm, structural).  PLAN: Orders Placed This Encounter  Procedures  . MR Brain W Wo Contrast  . Vitamin B12  . TSH  . Hemoglobin A1c   Return in about 3 months (around 11/18/2013).    Penni Bombard, MD 02/16/6810, 57:26 PM Certified in Neurology, Neurophysiology and Neuroimaging  [facility] [address] [place], [place] [address] [phone]

## 2013-08-22 LAB — HEMOGLOBIN A1C
Est. average glucose Bld gHb Est-mCnc: 126 mg/dL
HEMOGLOBIN A1C: 6 % — AB (ref 4.8–5.6)

## 2013-08-22 LAB — TSH: TSH: 0.662 u[IU]/mL (ref 0.450–4.500)

## 2013-08-22 LAB — VITAMIN B12: VITAMIN B 12: 380 pg/mL (ref 211–946)

## 2013-08-28 ENCOUNTER — Ambulatory Visit: Payer: Medicaid Other

## 2013-09-04 ENCOUNTER — Ambulatory Visit
Admission: RE | Admit: 2013-09-04 | Discharge: 2013-09-04 | Disposition: A | Discharge status: Home / Self Care | Payer: Medicaid Other | Source: Ambulatory Visit | Attending: Diagnostic Neuroimaging | Admitting: Diagnostic Neuroimaging

## 2013-09-04 DIAGNOSIS — R413 Other amnesia: Secondary | ICD-10-CM

## 2013-09-04 DIAGNOSIS — R55 Syncope and collapse: Secondary | ICD-10-CM

## 2013-09-04 DIAGNOSIS — G3184 Mild cognitive impairment, so stated: Secondary | ICD-10-CM

## 2013-09-04 DIAGNOSIS — R29898 Other symptoms and signs involving the musculoskeletal system: Secondary | ICD-10-CM

## 2013-09-04 MED ORDER — GADOBENATE DIMEGLUMINE 529 MG/ML IV SOLN
20.0000 mL | Freq: Once | INTRAVENOUS | Status: AC | PRN
  Administered 2013-09-04: 20 mL via INTRAVENOUS

## 2013-09-18 ENCOUNTER — Ambulatory Visit (INDEPENDENT_AMBULATORY_CARE_PROVIDER_SITE_OTHER): Payer: Medicaid Other | Admitting: Obstetrics

## 2013-09-18 ENCOUNTER — Encounter: Payer: Self-pay | Admitting: Obstetrics

## 2013-09-18 VITALS — BP 113/75 | HR 78 | Temp 98.6°F | Wt 209.0 lb

## 2013-09-18 DIAGNOSIS — Z0000 Encounter for general adult medical examination without abnormal findings: Secondary | ICD-10-CM

## 2013-09-18 DIAGNOSIS — N939 Abnormal uterine and vaginal bleeding, unspecified: Secondary | ICD-10-CM | POA: Insufficient documentation

## 2013-09-18 DIAGNOSIS — N926 Irregular menstruation, unspecified: Secondary | ICD-10-CM

## 2013-09-18 DIAGNOSIS — N390 Urinary tract infection, site not specified: Secondary | ICD-10-CM

## 2013-09-18 LAB — POCT URINALYSIS DIPSTICK
Bilirubin, UA: NEGATIVE
Blood, UA: NEGATIVE
Glucose, UA: NEGATIVE
Ketones, UA: NEGATIVE
Nitrite, UA: NEGATIVE
Spec Grav, UA: 1.025
UROBILINOGEN UA: NEGATIVE
pH, UA: 5

## 2013-09-18 NOTE — Progress Notes (Addendum)
Subjective:     Meghan Solis is a 43 y.o. female here for a routine exam.  Current complaints: pt states that she has had 2 cycles in the month of February.  Pt states that the 2 cycles were very heavy.  Pt states that the cycles were about 5 days in length.  Pt states that she felt very fatigued during this time.  Pt states that she is to see her Hematologist tomorrow and will have some lab work then. Pt is also in need of Urology referral for frequent UTI's.  Personal health questionnaire reviewed: yes.   Gynecologic History Patient's last menstrual period was 09/04/2013. Contraception: none Last Pap: 2014. Results were: normal Last mammogram:   Obstetric History OB History  Gravida Para Term Preterm AB SAB TAB Ectopic Multiple Living  3 2 2  1 1    2     # Outcome Date GA Lbr Len/2nd Weight Sex Delivery Anes PTL Lv  3 TRM           2 TRM           1 SAB                The following portions of the patient's history were reviewed and updated as appropriate: allergies, current medications, past family history, past medical history, past social history, past surgical history and problem list.  Review of Systems Pertinent items are noted in HPI.    Objective:    General appearance: alert and no distress Abdomen: normal findings: soft, non-tender Pelvic: cervix normal in appearance, external genitalia normal, no adnexal masses or tenderness, no cervical motion tenderness, rectovaginal septum normal, uterus normal size, shape, and consistency and vagina normal without discharge    Assessment:    Healthy female exam.   AUB.  R/O endometrial hyperplasia or polyp.   Plan:    Education reviewed: safe sex/STD prevention and weight bearing exercise. Contraception: none. Follow up in: 2 weeks. Ultrasound ordered.

## 2013-09-19 ENCOUNTER — Other Ambulatory Visit (HOSPITAL_BASED_OUTPATIENT_CLINIC_OR_DEPARTMENT_OTHER): Payer: Medicaid Other | Admitting: Lab

## 2013-09-19 ENCOUNTER — Ambulatory Visit (HOSPITAL_BASED_OUTPATIENT_CLINIC_OR_DEPARTMENT_OTHER): Payer: Medicaid Other | Admitting: Hematology & Oncology

## 2013-09-19 ENCOUNTER — Other Ambulatory Visit: Payer: Self-pay | Admitting: *Deleted

## 2013-09-19 ENCOUNTER — Encounter: Payer: Self-pay | Admitting: Hematology & Oncology

## 2013-09-19 ENCOUNTER — Ambulatory Visit (HOSPITAL_BASED_OUTPATIENT_CLINIC_OR_DEPARTMENT_OTHER): Payer: Medicaid Other

## 2013-09-19 VITALS — BP 115/76 | HR 76 | Temp 97.7°F | Resp 14 | Ht 67.0 in | Wt 210.0 lb

## 2013-09-19 DIAGNOSIS — K909 Intestinal malabsorption, unspecified: Secondary | ICD-10-CM

## 2013-09-19 DIAGNOSIS — D569 Thalassemia, unspecified: Secondary | ICD-10-CM

## 2013-09-19 DIAGNOSIS — D500 Iron deficiency anemia secondary to blood loss (chronic): Secondary | ICD-10-CM

## 2013-09-19 DIAGNOSIS — N920 Excessive and frequent menstruation with regular cycle: Secondary | ICD-10-CM

## 2013-09-19 DIAGNOSIS — N939 Abnormal uterine and vaginal bleeding, unspecified: Secondary | ICD-10-CM

## 2013-09-19 DIAGNOSIS — B373 Candidiasis of vulva and vagina: Secondary | ICD-10-CM

## 2013-09-19 DIAGNOSIS — N390 Urinary tract infection, site not specified: Secondary | ICD-10-CM

## 2013-09-19 DIAGNOSIS — D649 Anemia, unspecified: Secondary | ICD-10-CM

## 2013-09-19 DIAGNOSIS — B3731 Acute candidiasis of vulva and vagina: Secondary | ICD-10-CM

## 2013-09-19 DIAGNOSIS — Z9884 Bariatric surgery status: Secondary | ICD-10-CM

## 2013-09-19 DIAGNOSIS — A499 Bacterial infection, unspecified: Secondary | ICD-10-CM

## 2013-09-19 LAB — WET PREP BY MOLECULAR PROBE
Candida species: POSITIVE — AB
GARDNERELLA VAGINALIS: NEGATIVE
Trichomonas vaginosis: NEGATIVE

## 2013-09-19 LAB — IRON AND TIBC CHCC
%SAT: 34 % (ref 21–57)
IRON: 92 ug/dL (ref 41–142)
TIBC: 269 ug/dL (ref 236–444)
UIBC: 177 ug/dL (ref 120–384)

## 2013-09-19 LAB — CBC WITH DIFFERENTIAL (CANCER CENTER ONLY)
BASO#: 0 10*3/uL (ref 0.0–0.2)
BASO%: 0.6 % (ref 0.0–2.0)
EOS ABS: 0.3 10*3/uL (ref 0.0–0.5)
EOS%: 5.1 % (ref 0.0–7.0)
HEMATOCRIT: 35.9 % (ref 34.8–46.6)
HEMOGLOBIN: 11.1 g/dL — AB (ref 11.6–15.9)
LYMPH#: 2 10*3/uL (ref 0.9–3.3)
LYMPH%: 40.1 % (ref 14.0–48.0)
MCH: 22.6 pg — ABNORMAL LOW (ref 26.0–34.0)
MCHC: 30.9 g/dL — ABNORMAL LOW (ref 32.0–36.0)
MCV: 73 fL — ABNORMAL LOW (ref 81–101)
MONO#: 0.3 10*3/uL (ref 0.1–0.9)
MONO%: 6.7 % (ref 0.0–13.0)
NEUT#: 2.4 10*3/uL (ref 1.5–6.5)
NEUT%: 47.5 % (ref 39.6–80.0)
Platelets: 253 10*3/uL (ref 145–400)
RBC: 4.91 10*6/uL (ref 3.70–5.32)
RDW: 13.9 % (ref 11.1–15.7)
WBC: 5.1 10*3/uL (ref 3.9–10.0)

## 2013-09-19 LAB — RETICULOCYTES (CHCC)
ABS RETIC: 54.7 10*3/uL (ref 19.0–186.0)
RBC.: 4.97 MIL/uL (ref 3.87–5.11)
Retic Ct Pct: 1.1 % (ref 0.4–2.3)

## 2013-09-19 LAB — GC/CHLAMYDIA PROBE AMP
CT Probe RNA: NEGATIVE
GC Probe RNA: NEGATIVE

## 2013-09-19 LAB — FERRITIN CHCC: Ferritin: 29 ng/ml (ref 9–269)

## 2013-09-19 MED ORDER — SODIUM CHLORIDE 0.9 % IV SOLN
1020.0000 mg | Freq: Once | INTRAVENOUS | Status: AC
  Administered 2013-09-19: 1020 mg via INTRAVENOUS
  Filled 2013-09-19: qty 34

## 2013-09-19 MED ORDER — FLUCONAZOLE 150 MG PO TABS: 150.0000 mg | ORAL_TABLET | Freq: Once | ORAL | Status: DC

## 2013-09-19 MED ORDER — FOLIC ACID 1 MG PO TABS
1.0000 mg | ORAL_TABLET | Freq: Every day | ORAL | Status: AC
Start: 2013-09-19 — End: ?

## 2013-09-19 NOTE — Progress Notes (Signed)
  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  Folic acid 1 mg by mouth daily   INTERIM HISTORY:  Ms. Tercero comes in for followup. She's still having heavy cycles. She feels tired. She saw her gynecologist., Surety changes were made with her medications.    We last saw her, her ferritin was down to 49 with an iron saturation of 24%. This is a low for her. We will give her iron today.    We did do a genotype assay on her blood. She is homozygous for alpha thalassemia.    I'll put her on folic acid.   She's had no fever. She's had no change in bowel or bladder habits. She's had some issues with her stomach. She had Helicobacter infection. This affected her lap band. She may need to have this adjusted.  She did have an MRI of the brain in February. This was unremarkable.  PHYSICAL EXAMINATION: 1 the well-nourished [place] American female. Vital signs are temperature of 97.7. Pulse 76. Blood pressure 150/76. Weight 210 pounds. Head exam shows a sclerotic is. There is no oral lesion. Conjunctiva slightly pale. Lungs are clear. Cardiac exam regular in rhythm. Abdomen soft. Mildly obese. She has a mass in the left upper quadrant which is her lap band. There is no liver or spleen. Extremities shows no clubbing cyanosis or edema. Skin no rashes.      LABORATORY STUDIES:  White cell count 501. He will 1.1. Hematocrit 36. Platelet count 253. MCV 73.  Blood smear shows some target cells. There is no nucleated red cells. White cells are unremarkable.   IMPRESSION:  This Pressey is a 43 year old African American female. She is iron deficiency anemia. She has 2 reasons for this. She has menometrorrhagia and she cannot absorb because of her gastric bypass. We will give her IV iron today.    I'll put her on folic  acid.    Choice have a low MCV because of the alpha thalassemia.    I want to see her back in another 2 months or so.    I reviewed her lab work with her. I looked at her blood smear.   Volanda Napoleon, MD 09/19/2013

## 2013-09-20 LAB — CULTURE, OB URINE: Colony Count: 90000

## 2013-09-22 ENCOUNTER — Other Ambulatory Visit: Payer: Self-pay | Admitting: *Deleted

## 2013-09-22 DIAGNOSIS — N390 Urinary tract infection, site not specified: Secondary | ICD-10-CM

## 2013-09-22 MED ORDER — NITROFURANTOIN MONOHYD MACRO 100 MG PO CAPS: 100.0000 mg | ORAL_CAPSULE | Freq: Two times a day (BID) | ORAL | Status: DC

## 2013-09-27 ENCOUNTER — Other Ambulatory Visit: Payer: Self-pay | Admitting: Obstetrics

## 2013-09-27 ENCOUNTER — Ambulatory Visit (HOSPITAL_COMMUNITY)
Admission: RE | Admit: 2013-09-27 | Discharge: 2013-09-27 | Disposition: A | Discharge status: Home / Self Care | Payer: Medicaid Other | Source: Ambulatory Visit | Attending: Obstetrics | Admitting: Obstetrics

## 2013-09-27 DIAGNOSIS — N949 Unspecified condition associated with female genital organs and menstrual cycle: Secondary | ICD-10-CM | POA: Insufficient documentation

## 2013-09-27 DIAGNOSIS — N939 Abnormal uterine and vaginal bleeding, unspecified: Secondary | ICD-10-CM

## 2013-09-27 DIAGNOSIS — N920 Excessive and frequent menstruation with regular cycle: Secondary | ICD-10-CM | POA: Insufficient documentation

## 2013-09-27 DIAGNOSIS — N8500 Endometrial hyperplasia, unspecified: Secondary | ICD-10-CM | POA: Insufficient documentation

## 2013-09-27 DIAGNOSIS — D252 Subserosal leiomyoma of uterus: Secondary | ICD-10-CM | POA: Insufficient documentation

## 2013-09-27 DIAGNOSIS — D251 Intramural leiomyoma of uterus: Secondary | ICD-10-CM | POA: Insufficient documentation

## 2013-09-27 DIAGNOSIS — N938 Other specified abnormal uterine and vaginal bleeding: Secondary | ICD-10-CM | POA: Insufficient documentation

## 2013-09-27 DIAGNOSIS — N854 Malposition of uterus: Secondary | ICD-10-CM | POA: Insufficient documentation

## 2013-10-03 ENCOUNTER — Ambulatory Visit (INDEPENDENT_AMBULATORY_CARE_PROVIDER_SITE_OTHER): Payer: Medicaid Other | Admitting: Obstetrics

## 2013-10-03 ENCOUNTER — Encounter: Payer: Self-pay | Admitting: Obstetrics

## 2013-10-03 VITALS — BP 116/84 | HR 75 | Temp 99.1°F | Ht 67.0 in | Wt 210.0 lb

## 2013-10-03 DIAGNOSIS — Z113 Encounter for screening for infections with a predominantly sexual mode of transmission: Secondary | ICD-10-CM

## 2013-10-03 NOTE — Progress Notes (Signed)
Subjective:     Meghan Solis is a 43 y.o. female here for a routine exam.  Current complaints: Patient in office today for a follow-up. Patient had an ultrasound and is here to review results. Patient states shortly after her visit in the office her significant other went to the doctor and was given an antibiotic and an injection. Patient states her significant other had complaints of discharge from his penis.  Personal health questionnaire reviewed: yes.   Gynecologic History Patient's last menstrual period was 09/04/2013. Contraception: none  Obstetric History OB History  Gravida Para Term Preterm AB SAB TAB Ectopic Multiple Living  3 2 2  1 1    2     # Outcome Date GA Lbr Len/2nd Weight Sex Delivery Anes PTL Lv  3 TRM           2 TRM           1 SAB                The following portions of the patient's history were reviewed and updated as appropriate: allergies, current medications, past family history, past medical history, past social history, past surgical history and problem list.  Review of Systems Pertinent items are noted in HPI.    Objective:    General appearance: alert and no distress Abdomen: normal findings: soft, non-tender Pelvic: cervix normal in appearance, external genitalia normal, no adnexal masses or tenderness, no cervical motion tenderness, rectovaginal septum normal, uterus normal size, shape, and consistency and vagina normal without discharge    Assessment:    Healthy female exam.   ? STD exposure.  Cultures done. Plan:    Education reviewed: safe sex/STD prevention. Contraception: none. Follow up in: several months.

## 2013-10-04 ENCOUNTER — Encounter: Payer: Self-pay | Admitting: Obstetrics

## 2013-10-04 DIAGNOSIS — Z113 Encounter for screening for infections with a predominantly sexual mode of transmission: Secondary | ICD-10-CM | POA: Insufficient documentation

## 2013-10-04 LAB — GC/CHLAMYDIA PROBE AMP
CT Probe RNA: POSITIVE — AB
GC PROBE AMP APTIMA: NEGATIVE

## 2013-10-04 LAB — WET PREP BY MOLECULAR PROBE
Candida species: POSITIVE — AB
Gardnerella vaginalis: NEGATIVE
Trichomonas vaginosis: NEGATIVE

## 2013-10-09 ENCOUNTER — Other Ambulatory Visit: Payer: Self-pay | Admitting: *Deleted

## 2013-10-09 DIAGNOSIS — B373 Candidiasis of vulva and vagina: Secondary | ICD-10-CM

## 2013-10-09 DIAGNOSIS — A749 Chlamydial infection, unspecified: Secondary | ICD-10-CM

## 2013-10-09 DIAGNOSIS — B3731 Acute candidiasis of vulva and vagina: Secondary | ICD-10-CM

## 2013-10-09 MED ORDER — CEFIXIME 400 MG PO TABS: 400.0000 mg | ORAL_TABLET | Freq: Once | ORAL | Status: DC

## 2013-10-09 MED ORDER — FLUCONAZOLE 150 MG PO TABS: 150.0000 mg | ORAL_TABLET | ORAL | Status: DC

## 2013-10-09 MED ORDER — AZITHROMYCIN 250 MG PO TABS: 1000.0000 mg | ORAL_TABLET | Freq: Once | ORAL | Status: DC

## 2013-10-13 ENCOUNTER — Ambulatory Visit: Payer: Medicaid Other | Admitting: Cardiology

## 2013-10-23 ENCOUNTER — Ambulatory Visit (HOSPITAL_COMMUNITY)
Admission: RE | Admit: 2013-10-23 | Discharge: 2013-10-23 | Disposition: A | Discharge status: Home / Self Care | Payer: Medicaid Other | Source: Ambulatory Visit | Attending: Cardiology | Admitting: Cardiology

## 2013-10-23 ENCOUNTER — Other Ambulatory Visit: Payer: Self-pay | Admitting: Obstetrics

## 2013-10-23 ENCOUNTER — Encounter: Payer: Self-pay | Admitting: Physician Assistant

## 2013-10-23 ENCOUNTER — Ambulatory Visit (INDEPENDENT_AMBULATORY_CARE_PROVIDER_SITE_OTHER): Payer: Medicaid Other | Admitting: Physician Assistant

## 2013-10-23 VITALS — BP 106/73 | HR 87 | Ht 67.0 in | Wt 215.0 lb

## 2013-10-23 DIAGNOSIS — M79604 Pain in right leg: Secondary | ICD-10-CM

## 2013-10-23 DIAGNOSIS — M79609 Pain in unspecified limb: Secondary | ICD-10-CM

## 2013-10-23 DIAGNOSIS — I8390 Asymptomatic varicose veins of unspecified lower extremity: Secondary | ICD-10-CM | POA: Insufficient documentation

## 2013-10-23 DIAGNOSIS — M7120 Synovial cyst of popliteal space [Baker], unspecified knee: Secondary | ICD-10-CM | POA: Insufficient documentation

## 2013-10-23 DIAGNOSIS — R52 Pain, unspecified: Secondary | ICD-10-CM

## 2013-10-23 DIAGNOSIS — M25569 Pain in unspecified knee: Secondary | ICD-10-CM | POA: Insufficient documentation

## 2013-10-23 NOTE — Progress Notes (Signed)
Date:  10/23/2013   ID:  Meghan Solis, DOB Jul 25, 1970, MRN [id number]  PCP:  Ricke Hey, MD  Primary Cardiologist:  Ellyn Hack     History of Present Illness: Meghan Solis is a 43 y.o. female the history of DVT, left popliteal cyst, fibromyalgia, thalassemia, uterine leiomyoma, bilateral knee arthritis with meniscal tears in the right knee requiring arthroscopic surgery, left bending March 2012, D&C of the uterus in the past., Right lower extremity pain.    Patient presents today with complaints of leg hurting and weakness. This seems generalized however she does complain of severe tenderness and prickling feeling on her right thigh and lateral aspect right above some varicosities. She also reports pain in the right leg in the popliteal region. This is her main concern for today.  She denies any lower extremity edema.  Her legs do hurt when she walks but I do not think this is claudication.    The patient currently denies nausea, vomiting, fever, chest pain, shortness of breath, orthopnea, dizziness, PND, cough, congestion, abdominal pain, hematochezia, melena, lower extremity edema, claudication.  Wt Readings from Last 3 Encounters:  10/23/13 215 lb (97.523 kg)  10/03/13 210 lb (95.255 kg)  09/19/13 210 lb (95.255 kg)     Past Medical History  Diagnosis Date  . Fibromyalgia   . Thalassanemia   . Medial meniscus tear, right knee 05/20/2012  . Lateral meniscus tear 05/20/2012  . DVT (deep venous thrombosis)     Current Outpatient Prescriptions  Medication Sig Dispense Refill  . fluticasone (FLONASE) 50 MCG/ACT nasal spray Place 1 spray into both nostrils daily as needed for allergies or rhinitis.      . folic acid (FOLVITE) 1 MG tablet Take 1 tablet (1 mg total) by mouth daily.  90 tablet  3  . gabapentin (NEURONTIN) 800 MG tablet Take 800 mg by mouth 3 (three) times daily.      . Multiple Vitamin (MULTIVITAMIN WITH MINERALS) TABS Take 1 tablet by mouth daily.      .  Oxycodone HCl 10 MG TABS Take 10 mg by mouth daily as needed (pain).      . traMADol (ULTRAM) 50 MG tablet Take 50 mg by mouth 3 (three) times daily.       . [DISCONTINUED] famotidine (PEPCID AC) 10 MG chewable tablet Chew 20 mg by mouth 2 (two) times daily as needed. For stomach pain       No current facility-administered medications for this visit.    Allergies:    Allergies  Allergen Reactions  . Sulfa Antibiotics Swelling    Mouth swells    Social History:  The patient  reports that she has never smoked. She has never used smokeless tobacco. She reports that she drinks alcohol. She reports that she does not use illicit drugs.   Family history:   Family History  Problem Relation Age of Onset  . Diabetes Mother   . Cancer Father     ROS:  Please see the history of present illness.  All other systems reviewed and negative.   PHYSICAL EXAM: VS:  BP 106/73  Pulse 87  Ht 5\' 7"  (1.702 m)  Wt 215 lb (97.523 kg)  BMI 33.67 kg/m2 Obese, well developed, in no acute distress HEENT: Pupils are equal round react to light accommodation extraocular movements are intact.  Neck: no JVDNo cervical lymphadenopathy. Cardiac: Regular rate and rhythm without murmurs rubs or gallops. Lungs:  clear to auscultation bilaterally, no wheezing, rhonchi or rales  Ext: no lower extremity edema.  2+ radial and dorsalis pedis, PT, popliteal pulses. She has no discrete mass noted behind her right knee Skin: warm and dry,  she is an area of varicosities on her right thigh lateral aspect which is very tender. Neuro:  Grossly normal    ASSESSMENT AND PLAN:  Problem List Items Addressed This Visit   Pain of right lower extremity     Patient complaining of being very tender behind the knee the right lower extremity. We are able to obtain venous Dopplers today this was negative for DVT in popliteal cyst. I have recommended that she followup with an orthopedist regarding the generalized weakness which may be  related to her back. She does complain of some tenderness in the lumbar region.    Varicosities of leg     After reading that Dr. Allison Quarry previous note he was considering referral to Dr. Debara Pickett for assessment of her varicosities. We'll consider this again in the future.     Other Visit Diagnoses   Baker cyst    -  Primary    Relevant Orders       Lower Extremity Venous Duplex Right    Pain        Relevant Orders       Lower Extremity Venous Duplex Right

## 2013-10-23 NOTE — Assessment & Plan Note (Signed)
After reading that Dr. Allison Quarry previous note he was considering referral to Dr. Debara Pickett for assessment of her varicosities. We'll consider this again in the future.

## 2013-10-23 NOTE — Patient Instructions (Signed)
1.  We will check venous dopplers in your right leg. 2.  Follow up with Dr. Ellyn Hack after the study.

## 2013-10-23 NOTE — Progress Notes (Signed)
Right Lower Ext. Venous Duplex Completed. Negative for DVT and Baker's Cyst. Oda Cogan, BS, RDMS, RVT

## 2013-10-23 NOTE — Assessment & Plan Note (Signed)
Patient complaining of being very tender behind the knee the right lower extremity. We are able to obtain venous Dopplers today this was negative for DVT in popliteal cyst. I have recommended that she followup with an orthopedist regarding the generalized weakness which may be related to her back. She does complain of some tenderness in the lumbar region.

## 2013-10-27 ENCOUNTER — Telehealth: Payer: Self-pay | Admitting: *Deleted

## 2013-10-27 NOTE — Telephone Encounter (Signed)
Spoke to patient.She states she received results at her appointment with Tarri Fuller PA

## 2013-10-27 NOTE — Telephone Encounter (Signed)
Message copied by Raiford Simmonds on Fri Oct 27, 2013  3:16 PM ------      Message from: Leonie Man      Created: Wed Oct 25, 2013  9:14 PM       Normal Venous doppler of R F Vein.  No DVT or Baker's Cyst.            Leonie Man, MD            I did not order this test -- not sure what it was ordered for.             ------

## 2013-11-20 ENCOUNTER — Ambulatory Visit: Payer: Medicaid Other | Admitting: Hematology & Oncology

## 2013-11-20 ENCOUNTER — Other Ambulatory Visit: Payer: Medicaid Other | Admitting: Lab

## 2013-11-21 ENCOUNTER — Ambulatory Visit: Payer: Medicaid Other | Admitting: Diagnostic Neuroimaging

## 2013-11-22 ENCOUNTER — Ambulatory Visit: Payer: Self-pay | Admitting: Diagnostic Neuroimaging

## 2013-12-12 ENCOUNTER — Other Ambulatory Visit: Payer: Self-pay | Admitting: Obstetrics

## 2013-12-12 ENCOUNTER — Ambulatory Visit (HOSPITAL_COMMUNITY)
Admission: RE | Admit: 2013-12-12 | Discharge: 2013-12-12 | Disposition: A | Discharge status: Home / Self Care | Payer: Medicaid Other | Source: Ambulatory Visit | Attending: Obstetrics | Admitting: Obstetrics

## 2013-12-12 DIAGNOSIS — Z0000 Encounter for general adult medical examination without abnormal findings: Secondary | ICD-10-CM

## 2013-12-12 DIAGNOSIS — Z1231 Encounter for screening mammogram for malignant neoplasm of breast: Secondary | ICD-10-CM

## 2014-01-03 ENCOUNTER — Telehealth: Payer: Self-pay | Admitting: Hematology & Oncology

## 2014-01-03 NOTE — Telephone Encounter (Signed)
Pt rescheduled 5-11 no show to 7-29

## 2014-01-30 ENCOUNTER — Encounter: Payer: Self-pay | Admitting: Obstetrics

## 2014-01-30 ENCOUNTER — Ambulatory Visit (INDEPENDENT_AMBULATORY_CARE_PROVIDER_SITE_OTHER): Payer: Medicaid Other | Admitting: Obstetrics

## 2014-01-30 VITALS — BP 114/80 | HR 79 | Temp 98.2°F | Ht 67.0 in | Wt 214.0 lb

## 2014-01-30 DIAGNOSIS — N390 Urinary tract infection, site not specified: Secondary | ICD-10-CM

## 2014-01-30 DIAGNOSIS — Z0000 Encounter for general adult medical examination without abnormal findings: Secondary | ICD-10-CM

## 2014-01-30 DIAGNOSIS — N939 Abnormal uterine and vaginal bleeding, unspecified: Secondary | ICD-10-CM

## 2014-01-30 DIAGNOSIS — N926 Irregular menstruation, unspecified: Secondary | ICD-10-CM

## 2014-01-30 MED ORDER — NORETHINDRONE ACETATE 5 MG PO TABS: 10.0000 mg | ORAL_TABLET | Freq: Every day | ORAL | Status: AC

## 2014-01-30 NOTE — Addendum Note (Signed)
Addended by: Ladona Ridgel on: 01/30/2014 01:05 PM   Modules accepted: Orders

## 2014-01-30 NOTE — Progress Notes (Signed)
Subjective:     Meghan Solis is a 43 y.o. female here for a routine exam.  Current complaints: Prolonged vaginal bleeding after period.    Personal health questionnaire:  Is patient Ashkenazi Jewish, have a family history of breast and/or ovarian cancer: no Is there a family history of uterine cancer diagnosed at age < 29, gastrointestinal cancer, urinary tract cancer, family member who is a Field seismologist syndrome-associated carrier: no Is the patient overweight and hypertensive, family history of diabetes, personal history of gestational diabetes or PCOS: no Is patient over 13, have PCOS,  family history of premature CHD under age 57, diabetes, smoke, have hypertension or peripheral artery disease:  no  The HPI was reviewed and explored in further detail by the provider. Gynecologic History Patient's last menstrual period was 01/14/2014. Contraception: none Last Pap: 2014. Results were: normal Last mammogram: 2015. Results were: normal  Obstetric History OB History  Gravida Para Term Preterm AB SAB TAB Ectopic Multiple Living  3 2 2  1 1    2     # Outcome Date GA Lbr Len/2nd Weight Sex Delivery Anes PTL Lv  3 TRM           2 TRM           1 SAB               Past Medical History  Diagnosis Date  . Fibromyalgia   . Thalassanemia   . Medial meniscus tear, right knee 05/20/2012  . Lateral meniscus tear 05/20/2012  . DVT (deep venous thrombosis)     Past Surgical History  Procedure Laterality Date  . Knee surgery      L knee  . Laparoscopic gastric banding    . Dilation and curettage of uterus    . Knee arthroscopy with lateral menisectomy  05/20/2012    Procedure: KNEE ARTHROSCOPY WITH LATERAL MENISECTOMY;  Surgeon: Johnny Bridge, MD;  Location: [place];  Service: Orthopedics;  Laterality: Right;  RIGHT KNEE ARTHROSCOPY LATERAL AND MEDIAL MENISCECTOMY   . Fatty tumor removed    . Hernia repair      Current outpatient prescriptions:fluticasone (FLONASE) 50  MCG/ACT nasal spray, Place 1 spray into both nostrils daily as needed for allergies or rhinitis., Disp: , Rfl: ;  folic acid (FOLVITE) 1 MG tablet, Take 1 tablet (1 mg total) by mouth daily., Disp: 90 tablet, Rfl: 3;  gabapentin (NEURONTIN) 800 MG tablet, Take 800 mg by mouth 3 (three) times daily., Disp: , Rfl:  Multiple Vitamin (MULTIVITAMIN WITH MINERALS) TABS, Take 1 tablet by mouth daily., Disp: , Rfl: ;  Oxycodone HCl 10 MG TABS, Take 10 mg by mouth daily as needed (pain)., Disp: , Rfl: ;  traMADol (ULTRAM) 50 MG tablet, Take 50 mg by mouth 3 (three) times daily. , Disp: , Rfl: ;  norethindrone (AYGESTIN) 5 MG tablet, Take 2 tablets (10 mg total) by mouth daily., Disp: 20 tablet, Rfl: 0 [DISCONTINUED] famotidine (PEPCID AC) 10 MG chewable tablet, Chew 20 mg by mouth 2 (two) times daily as needed. For stomach pain, Disp: , Rfl:  Allergies  Allergen Reactions  . Sulfa Antibiotics Swelling    Mouth swells    History  Substance Use Topics  . Smoking status: Never Smoker   . Smokeless tobacco: Never Used     Comment: never used tobacco  . Alcohol Use: Yes     Comment: occasionally: once a month    Family History  Problem Relation Age  of Onset  . Diabetes Mother   . Cancer Father       Review of Systems  Constitutional: negative for fatigue and weight loss Respiratory: negative for cough and wheezing Cardiovascular: negative for chest pain, fatigue and palpitations Gastrointestinal: negative for abdominal pain and change in bowel habits Musculoskeletal:negative for myalgias Neurological: negative for gait problems and tremors Behavioral/Psych: negative for abusive relationship, depression Endocrine: negative for temperature intolerance   Genitourinary:negative for genital lesions, hot flashes, sexual problems and vaginal discharge.  Positive for abnormal period with prolonged bleeding after period. Integument/breast: negative for breast lump, breast tenderness, nipple discharge and  skin lesion(s)    Objective:       BP 114/80  Pulse 79  Temp(Src) 98.2 F (36.8 C)  Ht 5\' 7"  (1.702 m)  Wt 214 lb (97.07 kg)  BMI 33.51 kg/m2  LMP 01/14/2014 General:   alert  Skin:   no rash or abnormalities  Lungs:   clear to auscultation bilaterally  Heart:   regular rate and rhythm, S1, S2 normal, no murmur, click, rub or gallop  Breasts:   normal without suspicious masses, skin or nipple changes or axillary nodes  Abdomen:  normal findings: no organomegaly, soft, non-tender and no hernia  Pelvis:  External genitalia: normal general appearance Urinary system: urethral meatus normal and bladder without fullness, nontender Vaginal: normal without tenderness, induration or masses Cervix: normal appearance Adnexa: normal bimanual exam Uterus: anteverted and non-tender, normal size   Lab Review Urine pregnancy test Labs reviewed yes Radiologic studies reviewed yes    Assessment:    Healthy female exam.   AUB, with prolonged period.   Plan:    Education reviewed: low fat, low cholesterol diet, self breast exams and weight bearing exercise. Follow up in: 4 weeks. Aygestin Rx for AUB   Meds ordered this encounter  Medications  . norethindrone (AYGESTIN) 5 MG tablet    Sig: Take 2 tablets (10 mg total) by mouth daily.    Dispense:  20 tablet    Refill:  0   Orders Placed This Encounter  Procedures  . Urine Culture  . CBC

## 2014-01-31 LAB — WET PREP BY MOLECULAR PROBE
Candida species: NEGATIVE
GARDNERELLA VAGINALIS: POSITIVE — AB
Trichomonas vaginosis: NEGATIVE

## 2014-01-31 LAB — CBC
HCT: 36.9 % (ref 36.0–46.0)
Hemoglobin: 11.4 g/dL — ABNORMAL LOW (ref 12.0–15.0)
MCH: 22.9 pg — ABNORMAL LOW (ref 26.0–34.0)
MCHC: 30.9 g/dL (ref 30.0–36.0)
MCV: 74.2 fL — ABNORMAL LOW (ref 78.0–100.0)
PLATELETS: 305 10*3/uL (ref 150–400)
RBC: 4.97 MIL/uL (ref 3.87–5.11)
RDW: 16.1 % — AB (ref 11.5–15.5)
WBC: 5.7 10*3/uL (ref 4.0–10.5)

## 2014-01-31 LAB — GC/CHLAMYDIA PROBE AMP
CT Probe RNA: NEGATIVE
GC Probe RNA: NEGATIVE

## 2014-01-31 LAB — URINE CULTURE: Colony Count: 50000

## 2014-02-01 LAB — PAP IG AND HPV HIGH-RISK: HPV DNA High Risk: NOT DETECTED

## 2014-02-02 ENCOUNTER — Telehealth: Payer: Self-pay | Admitting: *Deleted

## 2014-02-02 DIAGNOSIS — B9689 Other specified bacterial agents as the cause of diseases classified elsewhere: Secondary | ICD-10-CM

## 2014-02-02 DIAGNOSIS — N760 Acute vaginitis: Principal | ICD-10-CM

## 2014-02-02 MED ORDER — METRONIDAZOLE 500 MG PO TABS: 500.0000 mg | ORAL_TABLET | Freq: Two times a day (BID) | ORAL | Status: AC

## 2014-02-02 NOTE — Telephone Encounter (Signed)
Pt called in to office requesting return call for lab results. Return call made to pt.  Pt made aware of lab results and that she had +BV.  Pt request Rx sent to pharmacy.  Metronidazole 500mg  sent to pt pharmacy.

## 2014-02-07 ENCOUNTER — Ambulatory Visit (HOSPITAL_BASED_OUTPATIENT_CLINIC_OR_DEPARTMENT_OTHER): Payer: Medicaid Other

## 2014-02-07 ENCOUNTER — Other Ambulatory Visit (HOSPITAL_BASED_OUTPATIENT_CLINIC_OR_DEPARTMENT_OTHER): Payer: Medicaid Other | Admitting: Lab

## 2014-02-07 ENCOUNTER — Ambulatory Visit (HOSPITAL_BASED_OUTPATIENT_CLINIC_OR_DEPARTMENT_OTHER): Payer: Medicaid Other | Admitting: Family

## 2014-02-07 VITALS — BP 113/80 | HR 80 | Temp 98.0°F | Wt 216.0 lb

## 2014-02-07 VITALS — BP 109/73 | HR 75 | Temp 97.2°F | Resp 20

## 2014-02-07 DIAGNOSIS — D569 Thalassemia, unspecified: Secondary | ICD-10-CM

## 2014-02-07 DIAGNOSIS — D649 Anemia, unspecified: Secondary | ICD-10-CM

## 2014-02-07 DIAGNOSIS — N939 Abnormal uterine and vaginal bleeding, unspecified: Secondary | ICD-10-CM

## 2014-02-07 DIAGNOSIS — N920 Excessive and frequent menstruation with regular cycle: Secondary | ICD-10-CM

## 2014-02-07 DIAGNOSIS — N926 Irregular menstruation, unspecified: Secondary | ICD-10-CM

## 2014-02-07 DIAGNOSIS — D508 Other iron deficiency anemias: Secondary | ICD-10-CM

## 2014-02-07 LAB — CBC WITH DIFFERENTIAL (CANCER CENTER ONLY)
BASO#: 0 10*3/uL (ref 0.0–0.2)
BASO%: 0.3 % (ref 0.0–2.0)
EOS%: 2.6 % (ref 0.0–7.0)
Eosinophils Absolute: 0.2 10*3/uL (ref 0.0–0.5)
HCT: 36 % (ref 34.8–46.6)
HEMOGLOBIN: 11.7 g/dL (ref 11.6–15.9)
LYMPH#: 2.7 10*3/uL (ref 0.9–3.3)
LYMPH%: 36.6 % (ref 14.0–48.0)
MCH: 23.8 pg — AB (ref 26.0–34.0)
MCHC: 32.5 g/dL (ref 32.0–36.0)
MCV: 73 fL — ABNORMAL LOW (ref 81–101)
MONO#: 0.4 10*3/uL (ref 0.1–0.9)
MONO%: 5.4 % (ref 0.0–13.0)
NEUT#: 4.1 10*3/uL (ref 1.5–6.5)
NEUT%: 55.1 % (ref 39.6–80.0)
Platelets: 301 10*3/uL (ref 145–400)
RBC: 4.92 10*6/uL (ref 3.70–5.32)
RDW: 14.1 % (ref 11.1–15.7)
WBC: 7.4 10*3/uL (ref 3.9–10.0)

## 2014-02-07 LAB — RETICULOCYTES (CHCC)
ABS Retic: 77.9 10*3/uL (ref 19.0–186.0)
RBC.: 4.87 MIL/uL (ref 3.87–5.11)
Retic Ct Pct: 1.6 % (ref 0.4–2.3)

## 2014-02-07 MED ORDER — SODIUM CHLORIDE 0.9 % IV SOLN
1020.0000 mg | Freq: Once | INTRAVENOUS | Status: AC
  Administered 2014-02-07: 1020 mg via INTRAVENOUS
  Filled 2014-02-07: qty 34

## 2014-02-07 NOTE — Patient Instructions (Signed)

## 2014-02-07 NOTE — Progress Notes (Signed)
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 She is preparing to move back home to [place] tomorrow morning with her daughter. She states that she feels much better and that her energy is up. She recently saw her gynecologist after she continued to have a heavy cycle for over 4 weeks. She was started on a medication to stop this cycle and she is only spotting occassionally now. In March, her ferritin was 29 with an iron saturation of 34%. This is a low for her so we will give her iron today. We did do a genotype assay on her blood. She is homozygous for alpha thalassemia. She is currently taking folic acid daily for this. She denies fever, chills, n/v, cough, rash, headache, dizziness, SOB, chest pain, palpitations, abdominal pain, constipation, diarrhea, blood in urine or stool. She denies swelling, tenderness, numbness or tingling her extremities. Her appetite is good.   CURRENT TREATMENT: IV iron-dose of Feraheme given today.  Folic acid 1 mg by mouth daily  REVIEW OF SYSTEMS: All other 10 point review of systems is negative.   PAST MEDICAL HISTORY: Past Medical History  Diagnosis Date  . Fibromyalgia   . Thalassanemia   . Medial meniscus tear, right knee 05/20/2012  . Lateral meniscus tear 05/20/2012  . DVT (deep venous thrombosis)    PAST SURGICAL HISTORY: Past Surgical History  Procedure Laterality Date  . Knee surgery      L knee  . Laparoscopic gastric banding    . Dilation and curettage of uterus    . Knee arthroscopy with lateral menisectomy  05/20/2012    Procedure: KNEE ARTHROSCOPY WITH LATERAL  MENISECTOMY;  Surgeon: Johnny Bridge, MD;  Location: [facility];  Service: Orthopedics;  Laterality: Right;  RIGHT KNEE ARTHROSCOPY LATERAL AND MEDIAL MENISCECTOMY   . Fatty tumor removed    . Hernia repair     FAMILY HISTORY Family History  Problem Relation Age of Onset  . Diabetes Mother   . Cancer Father    GYNECOLOGIC HISTORY:  Patient's last menstrual period was 01/14/2014.   SOCIAL HISTORY:  History   Social History  . Marital Status: Single    Spouse Name: N/A    Number of Children: 2  . Years of Education: College   Occupational History  .  Other    [employer]   Social History Main Topics  . Smoking status: Never Smoker   . Smokeless tobacco: Never Used     Comment: never used tobacco  . Alcohol Use: Yes     Comment: occasionally: once a month  . Drug Use: No  . Sexual Activity: Yes    Partners: Male    Birth Control/ Protection: None   Other Topics Concern  . Not on file   Social History Narrative   Patient lives at home with family.   Caffeine Use: 1/2 a cup occasionally   ADVANCED DIRECTIVES: <no information>   HEALTH MAINTENANCE: History  Substance Use Topics  . Smoking status: Never Smoker   . Smokeless tobacco: Never Used     Comment: never used tobacco  . Alcohol Use: Yes     Comment: occasionally: once a month  Colonoscopy: PAP: Bone density: Lipid panel:  Allergies  Allergen Reactions  . Sulfa Antibiotics Swelling    Mouth swells   Current Outpatient Prescriptions  Medication Sig Dispense Refill  . fluticasone (FLONASE) 50 MCG/ACT nasal spray Place 1 spray into both nostrils daily as needed for allergies or rhinitis.      . folic acid (FOLVITE) 1 MG tablet Take 1 tablet (1 mg total) by mouth daily.  90 tablet  3  . gabapentin (NEURONTIN) 800 MG tablet Take 800 mg by mouth 3 (three) times daily.      . Multiple Vitamin (MULTIVITAMIN WITH MINERALS) TABS Take 1 tablet by mouth daily.      . norethindrone  (AYGESTIN) 5 MG tablet Take 2 tablets (10 mg total) by mouth daily.  20 tablet  0  . Oxycodone HCl 10 MG TABS Take 10 mg by mouth daily as needed (pain).      . traMADol (ULTRAM) 50 MG tablet Take 50 mg by mouth 3 (three) times daily.       . metroNIDAZOLE (FLAGYL) 500 MG tablet Take 1 tablet (500 mg total) by mouth 2 (two) times daily.  14 tablet  0  . [DISCONTINUED] famotidine (PEPCID AC) 10 MG chewable tablet Chew 20 mg by mouth 2 (two) times daily as needed. For stomach pain       Current Facility-Administered Medications  Medication Dose Route Frequency Provider Last Rate Last Dose  . ferumoxytol (FERAHEME) 1,020 mg in sodium chloride 0.9 % 100 mL IVPB  1,020 mg Intravenous Once Eliezer Bottom, NP       OBJECTIVE: Filed Vitals:   02/07/14 1500  BP: 113/80  Pulse: 80  Temp: 98 F (36.7 C)   Body mass index is 33.82 kg/(m^2). ECOG FS:0 - Asymptomatic Ocular: Sclerae unicteric, pupils equal, round and reactive to light Ear-nose-throat: Oropharynx clear, dentition fair Lymphatic: No cervical or supraclavicular adenopathy Lungs no rales or rhonchi, good excursion bilaterally Heart regular rate and rhythm, no murmur appreciated Abd soft, nontender, positive bowel sounds MSK no focal spinal tenderness, no joint edema Neuro: non-focal, well-oriented, appropriate affect Breasts: Deferred  LAB RESULTS: No results found for this basename: SPEP, UPEP,  kappa and lambda light chains   Lab Results  Component Value Date   WBC 7.4 02/07/2014   NEUTROABS 4.1 02/07/2014   HGB 11.7 02/07/2014   HCT 36.0 02/07/2014   MCV 73* 02/07/2014   PLT 301 02/07/2014   No results found for this basename: LABCA2   No components found with this basename: [id number]   No results found for this basename: INR,  in the last 168 hours  STUDIES: No results found.  ASSESSMENT/PLAN: This Meghan Solis is a 43 year old African American female with iron deficiency anemia. There are two reasons for this: She has  menometrorrhagia and she cannot absorb because of her gastric bypass.  We will give her IV iron today before she moves. I She will continue taking her folic acid daily. We encouraged her to follow-up with a hematologist in [place] and we will gladly send her records once she gets established. She knows to call here with any questions or concerns and to go to the ED in the event of an emergency. We can certainly see her back here if need be.  Eliezer Bottom, NP 02/07/2014 4:14 PM

## 2014-02-08 LAB — FERRITIN CHCC: FERRITIN: 146 ng/mL (ref 9–269)

## 2014-02-08 LAB — IRON AND TIBC CHCC
%SAT: 27 % (ref 21–57)
IRON: 56 ug/dL (ref 41–142)
TIBC: 210 ug/dL — ABNORMAL LOW (ref 236–444)
UIBC: 154 ug/dL (ref 120–384)

## 2014-02-27 ENCOUNTER — Ambulatory Visit: Payer: Medicaid Other | Admitting: Obstetrics

## 2014-05-14 ENCOUNTER — Encounter: Payer: Self-pay | Admitting: Obstetrics

## 2014-06-19 ENCOUNTER — Telehealth: Payer: Self-pay | Admitting: Hematology & Oncology

## 2014-06-19 NOTE — Telephone Encounter (Signed)
Faxed medical records today to:  [facility] Dr. Berneta Sages, MD F: [phone] P: [phone]   Pt recently moved to [place].      COPY SCANNED

## 2014-08-21 ENCOUNTER — Other Ambulatory Visit: Payer: Self-pay | Admitting: Family

## 2014-10-08 IMAGING — CT CT ANGIO CHEST
2 of 7 series · 18 of 36 positions shown · IV contrast (CONTRAST)
Comparison: None.

CLINICAL DATA: Chest tightness and shortness of breath. History of
a DVT 1 year ago.

EXAM:
CT ANGIOGRAPHY CHEST WITH CONTRAST
TECHNIQUE: Multidetector CT imaging of the chest was performed using the
standard protocol during bolus administration of intravenous
contrast. Multiplanar CT image reconstructions including MIPs were
obtained to evaluate the vascular anatomy.
CONTRAST:  100mL OMNIPAQUE IOHEXOL 350 MG/ML SOLN

[Series 5: pe thins · axial · 0.68mm/px · z∈[+75,+296]mm · 17 of 249 slices shown]
[im 14/249  lung]
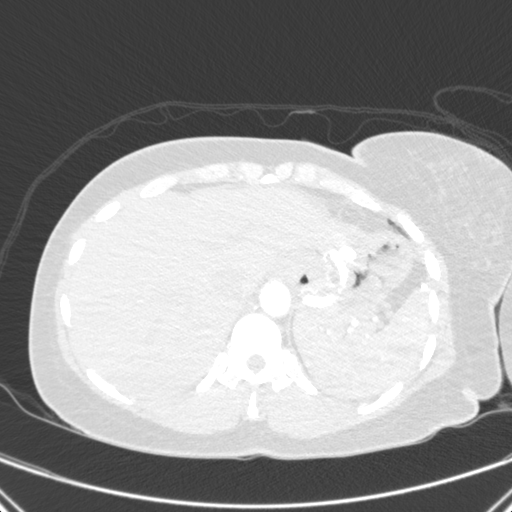
[im 28/249  mediastinal]
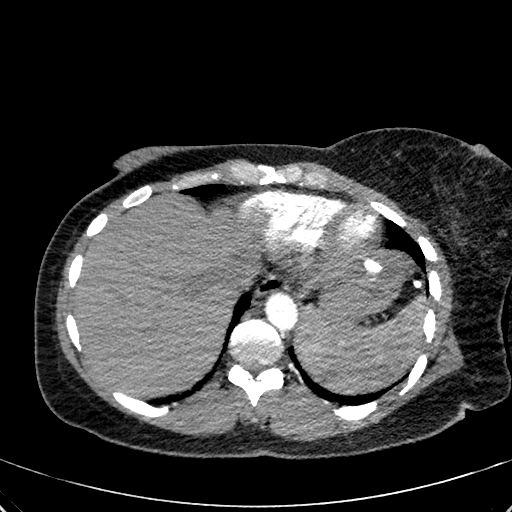
[im 42/249  lung]
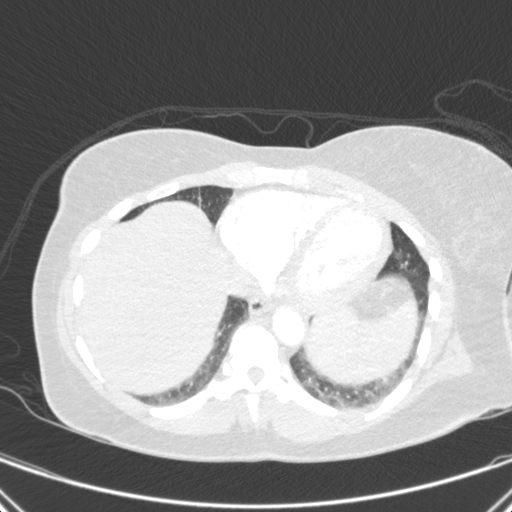
[im 56/249  mediastinal]
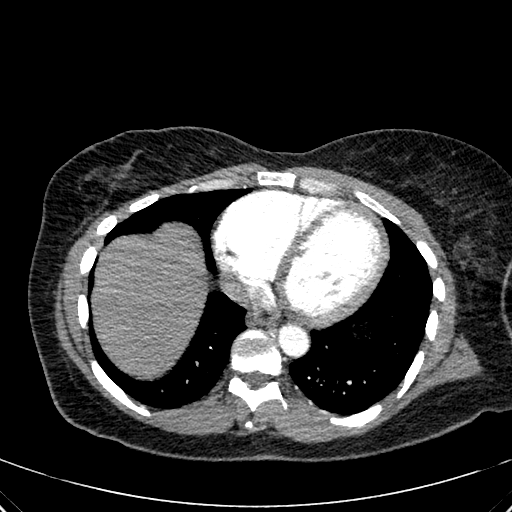
[im 69/249  lung]
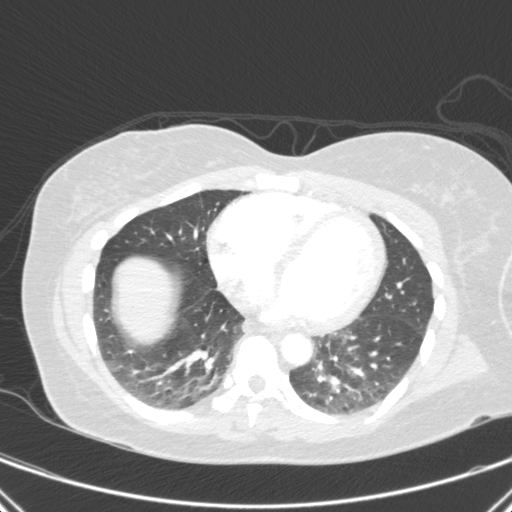
[im 83/249  mediastinal]
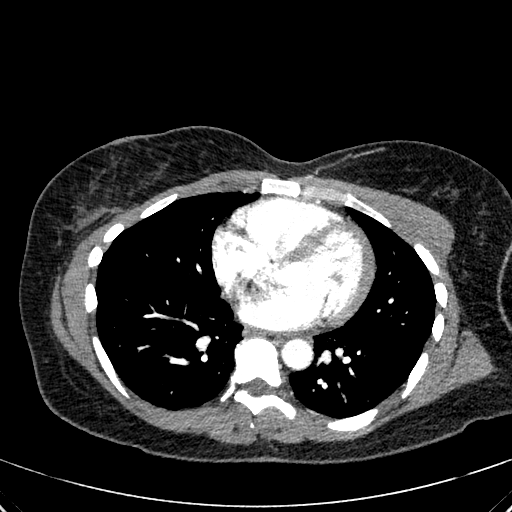
[im 97/249  lung]
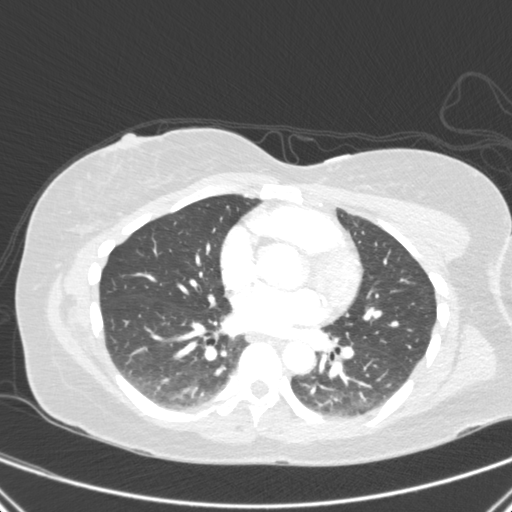
[im 111/249  mediastinal]
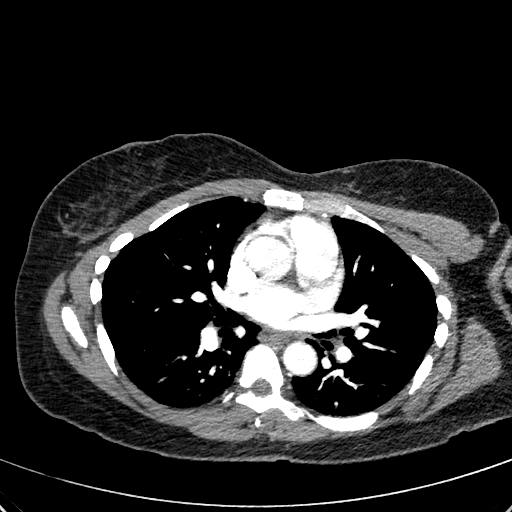
[im 125/249  lung]
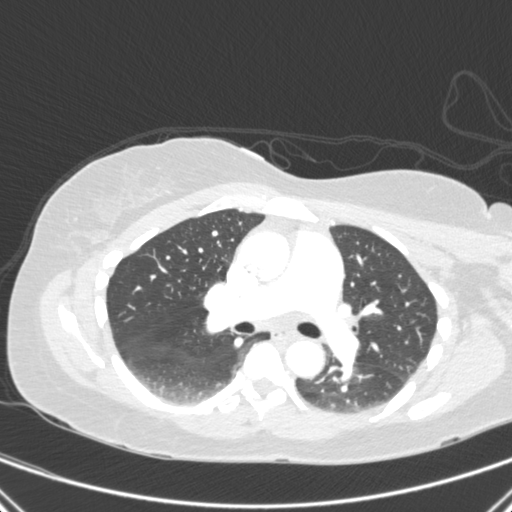
[im 138/249  mediastinal]
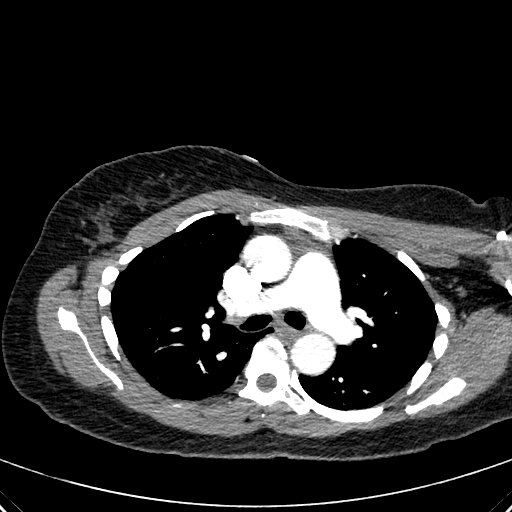
[im 152/249  lung]
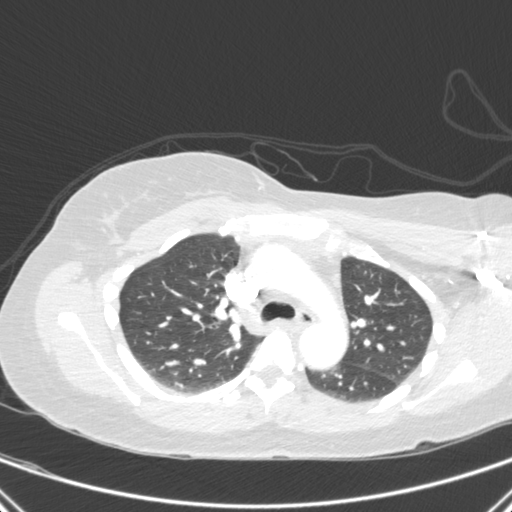
[im 166/249  mediastinal]
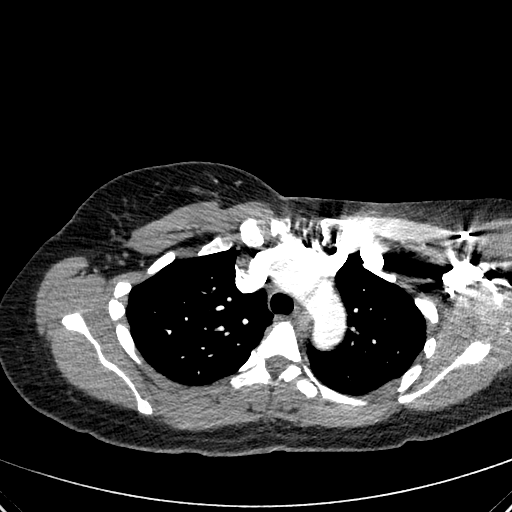
[im 180/249  lung]
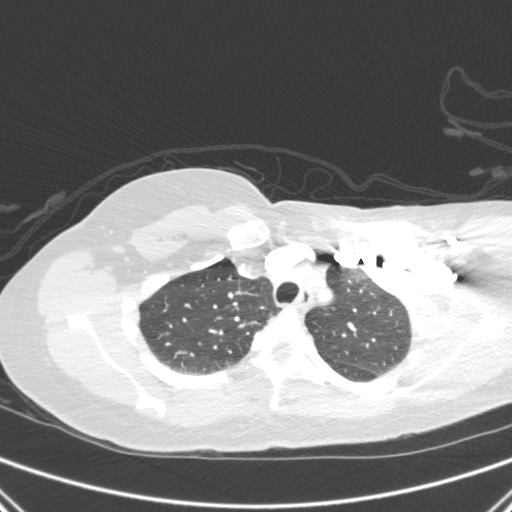
[im 193/249  mediastinal]
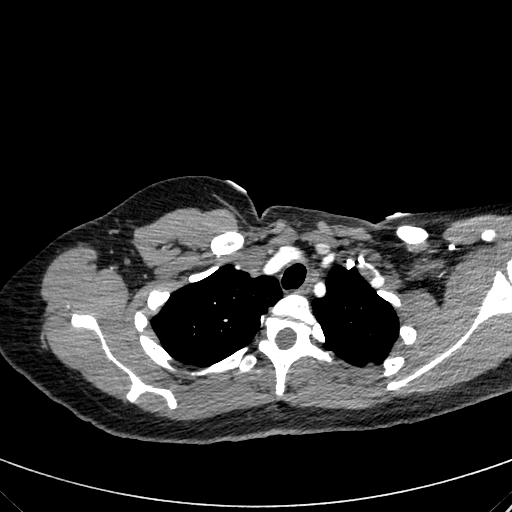
[im 207/249  lung]
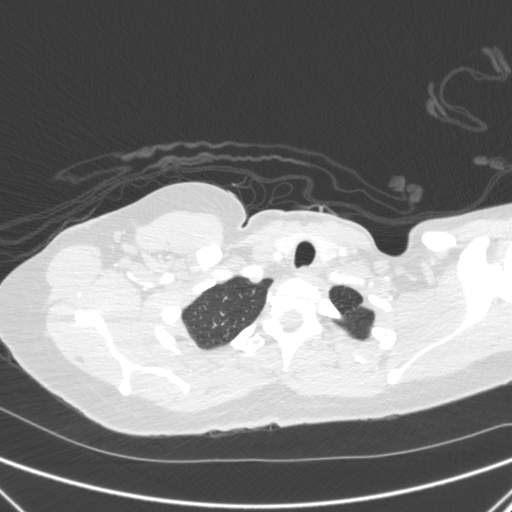
[im 221/249  mediastinal]
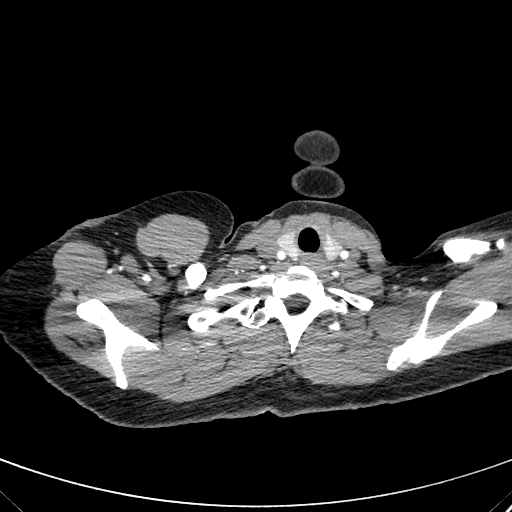
[im 235/249  lung]
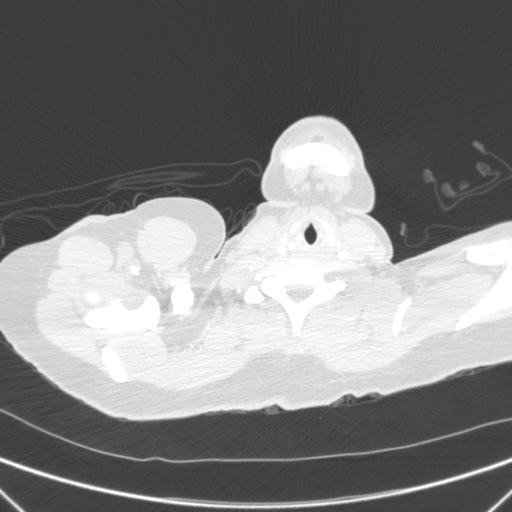

[mpr, coronals, coronal · coronal · 0.68mm/px · 1 of 102 slices shown]
[im 51/102  mediastinal]
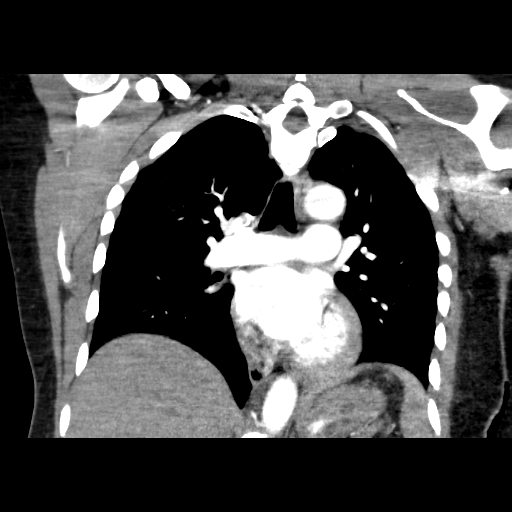

[18 of 36 positions shown; findings below may reference images not displayed]

FINDINGS: No evidence of a pulmonary embolus.

The heart is normal in size and configuration. The great vessels are
unremarkable. No mediastinal or hilar masses or pathologically
enlarged lymph nodes are seen.

There is dependent subsegmental atelectasis. The lungs are otherwise
clear. No pleural effusion or pneumothorax.

There is a partly imaged lap band in the epigastric region, which
appears well positioned. Limited visualization of the upper abdomen
is otherwise unremarkable.

No significant bony abnormality.

Review of the MIP images confirms the above findings.
IMPRESSION: 1. No evidence of a pulmonary embolus.
2. No acute findings.
3. Mild dependent subsegmental atelectasis. The lungs are otherwise
clear.

## 2015-02-06 ENCOUNTER — Encounter: Payer: Self-pay | Admitting: *Deleted

## 2015-03-08 ENCOUNTER — Encounter: Payer: Self-pay | Admitting: Cardiology

## 2015-03-11 ENCOUNTER — Encounter: Payer: Self-pay | Admitting: Cardiology

## 2015-06-08 ENCOUNTER — Other Ambulatory Visit: Payer: Self-pay | Admitting: Nurse Practitioner
